# Patient Record
Sex: Female | Born: 1960 | Race: Black or African American | Hispanic: No | Smoking: Never smoker
Health system: Southern US, Community
[De-identification: ages and names within clinical notes are randomized; demographics above are authoritative.]

## PROBLEM LIST (undated history)

## (undated) DIAGNOSIS — E119 Type 2 diabetes mellitus without complications: Secondary | ICD-10-CM

## (undated) DIAGNOSIS — D72829 Elevated white blood cell count, unspecified: Secondary | ICD-10-CM

## (undated) DIAGNOSIS — I10 Essential (primary) hypertension: Secondary | ICD-10-CM

## (undated) HISTORY — DX: Type 2 diabetes mellitus without complications: E11.9

## (undated) HISTORY — DX: Elevated white blood cell count, unspecified: D72.829

## (undated) HISTORY — PX: APPENDECTOMY: SHX54

## (undated) HISTORY — PX: TUBAL LIGATION: SHX77

---

## 2001-11-28 ENCOUNTER — Emergency Department (HOSPITAL_COMMUNITY): Admission: EM | Admit: 2001-11-28 | Discharge: 2001-11-28 | Payer: Self-pay | Admitting: Emergency Medicine

## 2008-07-30 ENCOUNTER — Inpatient Hospital Stay (HOSPITAL_COMMUNITY): Admission: EM | Admit: 2008-07-30 | Discharge: 2008-08-05 | Payer: Self-pay | Admitting: Emergency Medicine

## 2008-08-03 ENCOUNTER — Encounter (INDEPENDENT_AMBULATORY_CARE_PROVIDER_SITE_OTHER): Payer: Self-pay | Admitting: *Deleted

## 2008-08-04 ENCOUNTER — Encounter (INDEPENDENT_AMBULATORY_CARE_PROVIDER_SITE_OTHER): Payer: Self-pay | Admitting: *Deleted

## 2008-08-13 ENCOUNTER — Ambulatory Visit: Payer: Self-pay | Admitting: Internal Medicine

## 2008-08-14 ENCOUNTER — Ambulatory Visit: Payer: Self-pay | Admitting: *Deleted

## 2008-09-06 ENCOUNTER — Encounter (INDEPENDENT_AMBULATORY_CARE_PROVIDER_SITE_OTHER): Payer: Self-pay | Admitting: *Deleted

## 2008-09-09 ENCOUNTER — Telehealth: Payer: Self-pay | Admitting: Gastroenterology

## 2008-09-15 ENCOUNTER — Ambulatory Visit (HOSPITAL_COMMUNITY): Admission: RE | Admit: 2008-09-15 | Discharge: 2008-09-15 | Payer: Self-pay | Admitting: General Surgery

## 2008-09-15 ENCOUNTER — Encounter (INDEPENDENT_AMBULATORY_CARE_PROVIDER_SITE_OTHER): Payer: Self-pay | Admitting: *Deleted

## 2008-09-16 DIAGNOSIS — I10 Essential (primary) hypertension: Secondary | ICD-10-CM | POA: Insufficient documentation

## 2008-09-16 DIAGNOSIS — E669 Obesity, unspecified: Secondary | ICD-10-CM

## 2008-09-16 DIAGNOSIS — E1165 Type 2 diabetes mellitus with hyperglycemia: Secondary | ICD-10-CM | POA: Insufficient documentation

## 2008-09-16 DIAGNOSIS — E119 Type 2 diabetes mellitus without complications: Secondary | ICD-10-CM

## 2008-09-17 ENCOUNTER — Ambulatory Visit: Payer: Self-pay | Admitting: Gastroenterology

## 2008-09-17 DIAGNOSIS — R933 Abnormal findings on diagnostic imaging of other parts of digestive tract: Secondary | ICD-10-CM

## 2008-09-18 ENCOUNTER — Ambulatory Visit: Payer: Self-pay | Admitting: Gastroenterology

## 2008-09-18 ENCOUNTER — Telehealth: Payer: Self-pay | Admitting: Gastroenterology

## 2008-09-30 ENCOUNTER — Encounter (INDEPENDENT_AMBULATORY_CARE_PROVIDER_SITE_OTHER): Payer: Self-pay | Admitting: General Surgery

## 2008-09-30 ENCOUNTER — Ambulatory Visit (HOSPITAL_COMMUNITY): Admission: RE | Admit: 2008-09-30 | Discharge: 2008-09-30 | Payer: Self-pay | Admitting: General Surgery

## 2010-08-23 IMAGING — CT CT ABDOMEN W/ CM
2 of 5 series · 17 of 46 positions shown, 19 images · IV contrast (READICAT & 100 ML OMNI 300)
Comparison: 08/04/2008

CT ABDOMEN

CLINICAL DATA: History of appendicitis.

CT ABDOMEN AND PELVIS WITH CONTRAST
TECHNIQUE: Multidetector CT imaging of the abdomen and pelvis was
performed using the standard protocol following bolus
administration of intravenous contrast.
Contrast: 100 ml of Ymnipaque-EBB

[Series 2: routine abdomen · axial · 0.82mm/px · z∈[-456,-111]mm · 14 of 78 slices shown, 16 images]
[im 5/78  soft-tissue]
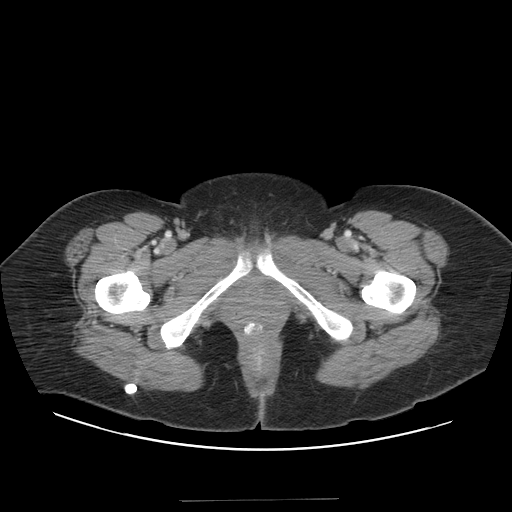
[im 5/78  bone]
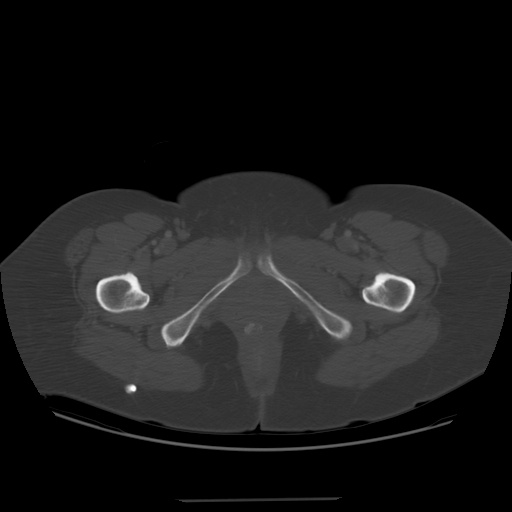
[im 9/78  soft-tissue]
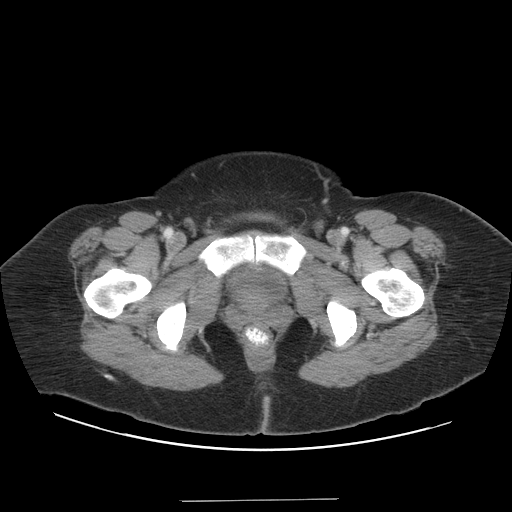
[im 17/78  soft-tissue]
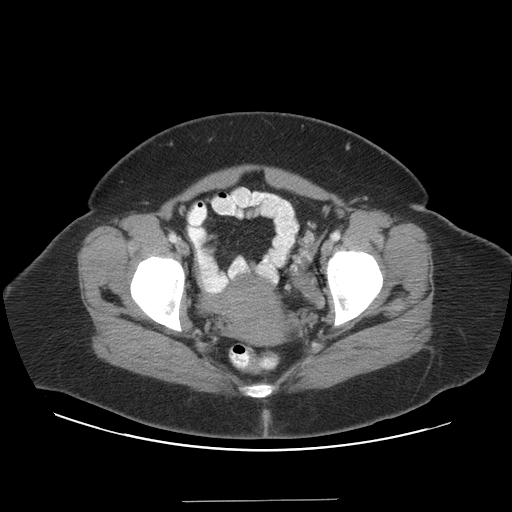
[im 21/78  soft-tissue]
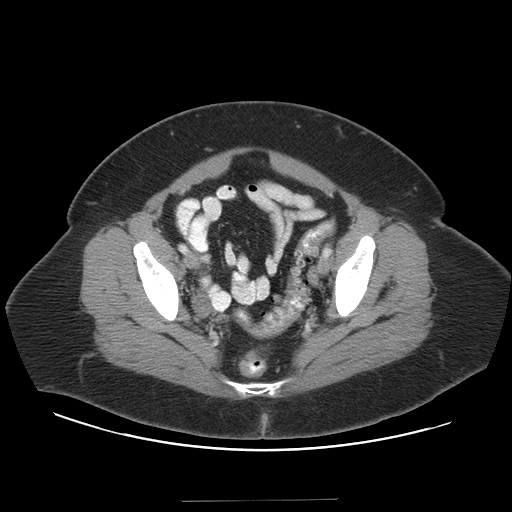
[im 25/78  soft-tissue]
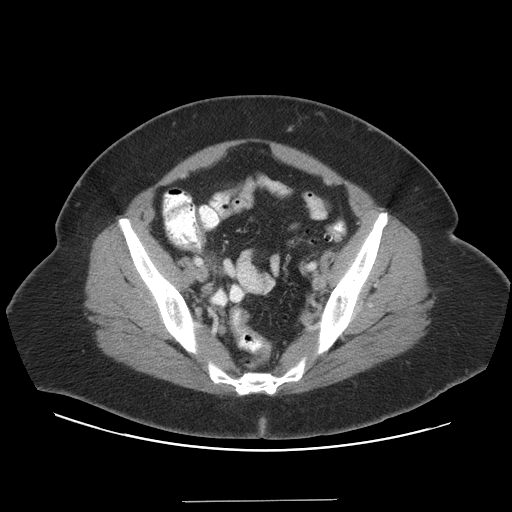
[im 33/78  soft-tissue]
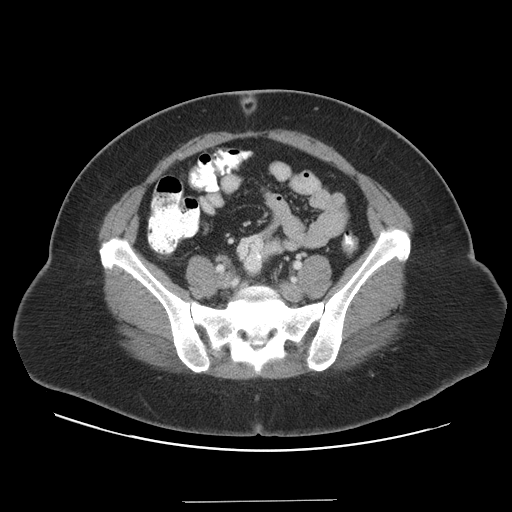
[im 37/78  soft-tissue]
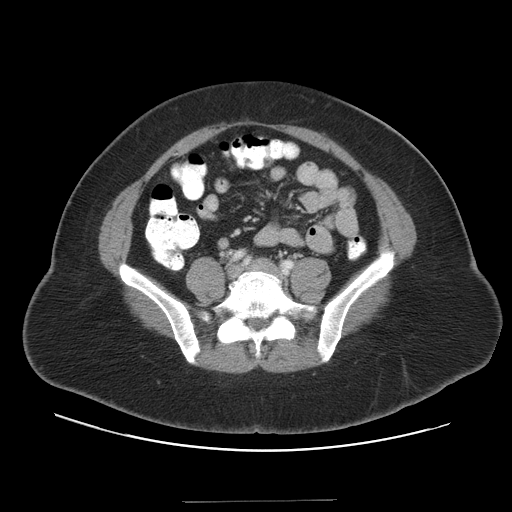
[im 41/78  soft-tissue]
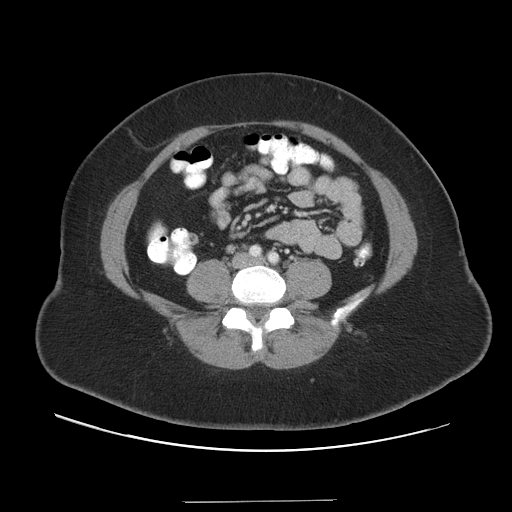
[im 45/78  soft-tissue]
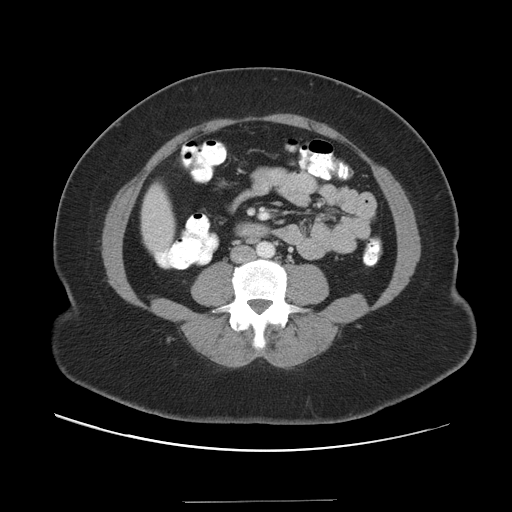
[im 45/78  bone]
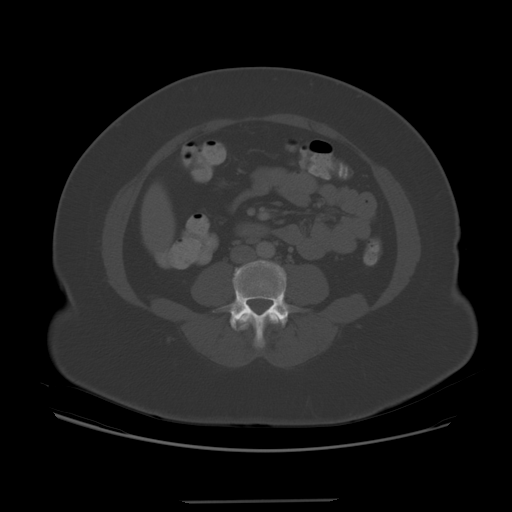
[im 53/78  soft-tissue]
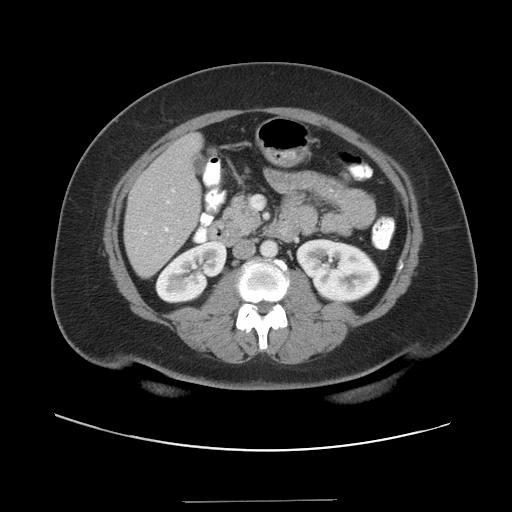
[im 57/78  soft-tissue]
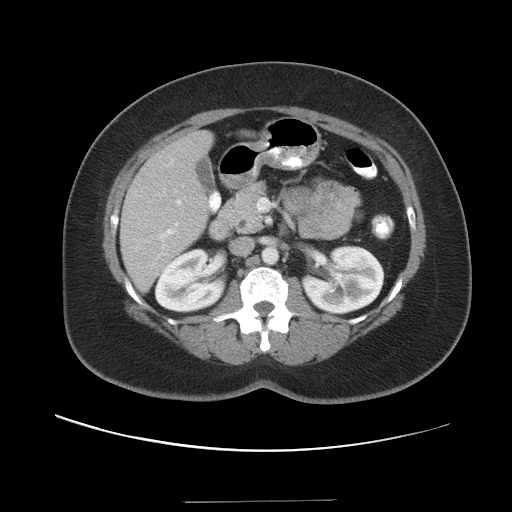
[im 61/78  soft-tissue]
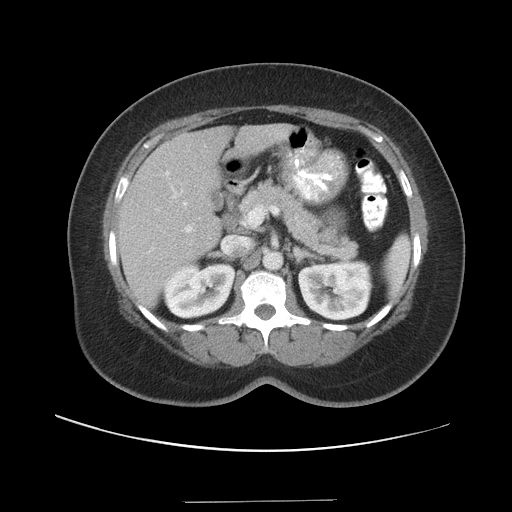
[im 69/78  soft-tissue]
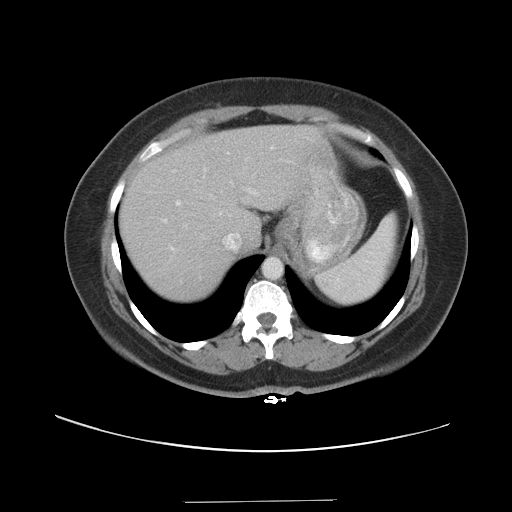
[im 73/78  soft-tissue]
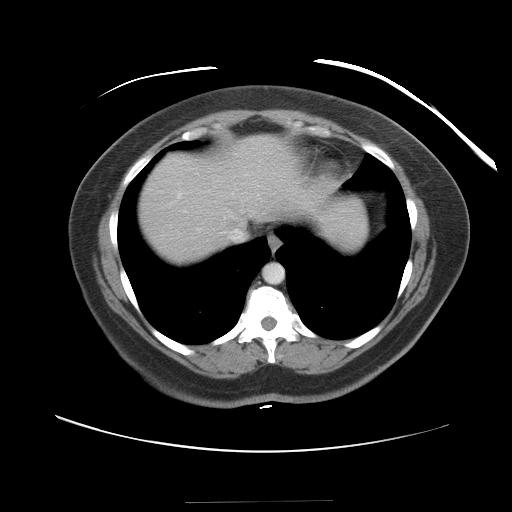

[Series 401: coronal · coronal · 0.82mm/px · 3 of 81 slices shown]
[im 27/81  soft-tissue]
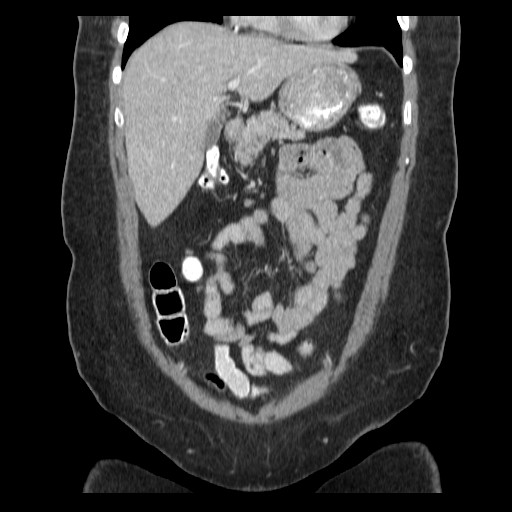
[im 36/81  soft-tissue]
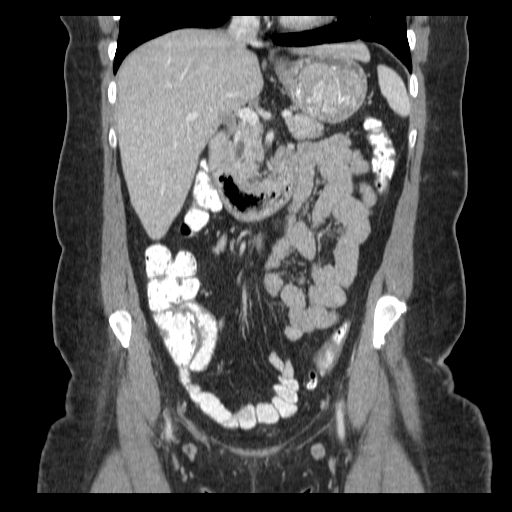
[im 45/81  soft-tissue]
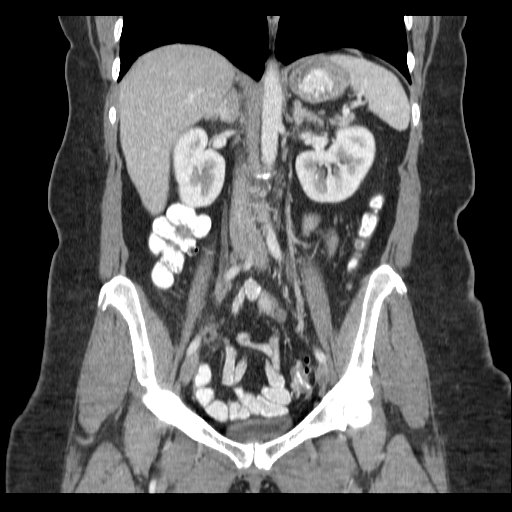

[17 of 46 positions shown; findings below may reference images not displayed]

FINDINGS: The solid abdominal organs are unremarkable and stable
in appearance.  No change and left renal cyst.  There are small
scattered mesenteric and retroperitoneal lymph nodes but no
adenopathy.  The stomach, duodenum, small bowel and colon
demonstrate no significant findings.  The aorta is normal in
caliber.  Gallbladder appears normal.  No significant bony
findings.
IMPRESSION: Unremarkable and stable CT appearance of the abdomen

CT PELVIS
FINDINGS: Resolution of the right lower quadrant inflammatory
process.  Minimal residual streaky scarring type changes in the
region of the appendix.  No pelvic abscess or acute inflammatory
changes.  There are small scattered pericecal and bilateral pelvic
sidewall lymph nodes.  The uterus and ovaries are unremarkable and
stable.  The bladder appears normal.  There is diverticulosis of
the sigmoid colon and mild wall thickening.  No pelvic mass,
adenopathy or free pelvic fluid collection.  Visualized small bowel
loops appear normal.  The cecum and terminal ileum are
unremarkable.

No significant bony findings.  No inguinal mass or hernia.
IMPRESSION: 1.  Resolution of pelvic inflammatory process.  Minimal residual
scarring type changes in the region of the appendix.
2.  Diverticulosis of the sigmoid colon.
3.  Unremarkable CT appearance of the uterus and ovaries.

## 2010-08-24 LAB — GLUCOSE, CAPILLARY
Glucose-Capillary: 120 mg/dL — ABNORMAL HIGH (ref 70–99)
Glucose-Capillary: 155 mg/dL — ABNORMAL HIGH (ref 70–99)

## 2010-08-24 LAB — URINALYSIS, ROUTINE W REFLEX MICROSCOPIC
Glucose, UA: NEGATIVE mg/dL
Hgb urine dipstick: NEGATIVE
Specific Gravity, Urine: 1.014 (ref 1.005–1.030)
Urobilinogen, UA: 0.2 mg/dL (ref 0.0–1.0)

## 2010-08-24 LAB — COMPREHENSIVE METABOLIC PANEL
BUN: 13 mg/dL (ref 6–23)
Calcium: 9.9 mg/dL (ref 8.4–10.5)
Creatinine, Ser: 0.68 mg/dL (ref 0.4–1.2)
GFR calc non Af Amer: >60 mL/min (ref >60)
Glucose, Bld: 128 mg/dL — ABNORMAL HIGH (ref 70–99)
Total Protein: 7.9 g/dL (ref 6.0–8.3)

## 2010-08-24 LAB — CBC
HCT: 41.7 % (ref 36.0–46.0)
Hemoglobin: 13.6 g/dL (ref 12.0–15.0)
MCHC: 32.5 g/dL (ref 30.0–36.0)
MCV: 85.8 fL (ref 78.0–100.0)
RDW: 14 % (ref 11.5–15.5)

## 2010-08-24 LAB — DIFFERENTIAL
Lymphs Abs: 2.6 10*3/uL (ref 0.7–4.0)
Monocytes Relative: 6 % (ref 3–12)
Neutro Abs: 6.9 10*3/uL (ref 1.7–7.7)
Neutrophils Relative %: 67 % (ref 43–77)

## 2010-08-26 LAB — GLUCOSE, CAPILLARY
Glucose-Capillary: 102 mg/dL — ABNORMAL HIGH (ref 70–99)
Glucose-Capillary: 124 mg/dL — ABNORMAL HIGH (ref 70–99)
Glucose-Capillary: 132 mg/dL — ABNORMAL HIGH (ref 70–99)
Glucose-Capillary: 134 mg/dL — ABNORMAL HIGH (ref 70–99)
Glucose-Capillary: 140 mg/dL — ABNORMAL HIGH (ref 70–99)
Glucose-Capillary: 144 mg/dL — ABNORMAL HIGH (ref 70–99)
Glucose-Capillary: 145 mg/dL — ABNORMAL HIGH (ref 70–99)
Glucose-Capillary: 149 mg/dL — ABNORMAL HIGH (ref 70–99)
Glucose-Capillary: 156 mg/dL — ABNORMAL HIGH (ref 70–99)
Glucose-Capillary: 156 mg/dL — ABNORMAL HIGH (ref 70–99)
Glucose-Capillary: 177 mg/dL — ABNORMAL HIGH (ref 70–99)
Glucose-Capillary: 183 mg/dL — ABNORMAL HIGH (ref 70–99)
Glucose-Capillary: 200 mg/dL — ABNORMAL HIGH (ref 70–99)
Glucose-Capillary: 211 mg/dL — ABNORMAL HIGH (ref 70–99)
Glucose-Capillary: 69 mg/dL — ABNORMAL LOW (ref 70–99)
Glucose-Capillary: 97 mg/dL (ref 70–99)

## 2010-08-26 LAB — CBC
HCT: 35.2 % — ABNORMAL LOW (ref 36.0–46.0)
HCT: 36.2 % (ref 36.0–46.0)
Hemoglobin: 11.6 g/dL — ABNORMAL LOW (ref 12.0–15.0)
Hemoglobin: 12 g/dL (ref 12.0–15.0)
MCHC: 32.8 g/dL (ref 30.0–36.0)
MCHC: 32.8 g/dL (ref 30.0–36.0)
MCV: 85.1 fL (ref 78.0–100.0)
MCV: 85.3 fL (ref 78.0–100.0)
MCV: 85.7 fL (ref 78.0–100.0)
MCV: 85.8 fL (ref 78.0–100.0)
Platelets: 368 10*3/uL (ref 150–400)
Platelets: 373 10*3/uL (ref 150–400)
Platelets: 396 10*3/uL (ref 150–400)
Platelets: 429 10*3/uL — ABNORMAL HIGH (ref 150–400)
Platelets: 456 10*3/uL — ABNORMAL HIGH (ref 150–400)
RBC: 4.11 MIL/uL (ref 3.87–5.11)
RBC: 4.65 MIL/uL (ref 3.87–5.11)
RDW: 13.2 % (ref 11.5–15.5)
RDW: 13.3 % (ref 11.5–15.5)
RDW: 13.4 % (ref 11.5–15.5)
WBC: 16.8 10*3/uL — ABNORMAL HIGH (ref 4.0–10.5)
WBC: 27.3 10*3/uL — ABNORMAL HIGH (ref 4.0–10.5)
WBC: 34.1 10*3/uL — ABNORMAL HIGH (ref 4.0–10.5)

## 2010-08-26 LAB — DIFFERENTIAL
Basophils Absolute: 0 10*3/uL (ref 0.0–0.1)
Basophils Absolute: 0 10*3/uL (ref 0.0–0.1)
Basophils Relative: 0 % (ref 0–1)
Eosinophils Absolute: 0.2 10*3/uL (ref 0.0–0.7)
Eosinophils Relative: 0 % (ref 0–5)
Eosinophils Relative: 1 % (ref 0–5)
Eosinophils Relative: 2 % (ref 0–5)
Lymphocytes Relative: 10 % — ABNORMAL LOW (ref 12–46)
Lymphocytes Relative: 19 % (ref 12–46)
Lymphocytes Relative: 8 % — ABNORMAL LOW (ref 12–46)
Lymphs Abs: 2.2 10*3/uL (ref 0.7–4.0)
Lymphs Abs: 2.7 10*3/uL (ref 0.7–4.0)
Lymphs Abs: 3.3 10*3/uL (ref 0.7–4.0)
Monocytes Absolute: 2.1 10*3/uL — ABNORMAL HIGH (ref 0.1–1.0)
Monocytes Relative: 7 % (ref 3–12)
Monocytes Relative: 8 % (ref 3–12)
Neutro Abs: 11.6 10*3/uL — ABNORMAL HIGH (ref 1.7–7.7)
Neutro Abs: 23.4 10*3/uL — ABNORMAL HIGH (ref 1.7–7.7)
Neutrophils Relative %: 69 % (ref 43–77)

## 2010-08-26 LAB — BASIC METABOLIC PANEL
BUN: 1 mg/dL — ABNORMAL LOW (ref 6–23)
BUN: 2 mg/dL — ABNORMAL LOW (ref 6–23)
BUN: 4 mg/dL — ABNORMAL LOW (ref 6–23)
CO2: 25 mEq/L (ref 19–32)
Chloride: 100 mEq/L (ref 96–112)
Chloride: 100 mEq/L (ref 96–112)
Chloride: 98 mEq/L (ref 96–112)
Creatinine, Ser: 0.52 mg/dL (ref 0.4–1.2)
Glucose, Bld: 178 mg/dL — ABNORMAL HIGH (ref 70–99)
Potassium: 3.9 mEq/L (ref 3.5–5.1)
Potassium: 4.3 mEq/L (ref 3.5–5.1)
Sodium: 137 mEq/L (ref 135–145)

## 2010-08-26 LAB — URINALYSIS, ROUTINE W REFLEX MICROSCOPIC
Bilirubin Urine: NEGATIVE
Ketones, ur: 15 mg/dL — AB
Nitrite: NEGATIVE
Specific Gravity, Urine: 1.01 (ref 1.005–1.030)
Urobilinogen, UA: 1 mg/dL (ref 0.0–1.0)
pH: 8 (ref 5.0–8.0)

## 2010-08-26 LAB — CULTURE, BLOOD (ROUTINE X 2): Culture: NO GROWTH

## 2010-08-26 LAB — COMPREHENSIVE METABOLIC PANEL
ALT: 125 U/L — ABNORMAL HIGH (ref 0–35)
AST: 114 U/L — ABNORMAL HIGH (ref 0–37)
Albumin: 2.8 g/dL — ABNORMAL LOW (ref 3.5–5.2)
CO2: 29 mEq/L (ref 19–32)
Calcium: 8.9 mg/dL (ref 8.4–10.5)
Creatinine, Ser: 0.79 mg/dL (ref 0.4–1.2)
GFR calc Af Amer: >60 mL/min (ref >60)
Sodium: 141 mEq/L (ref 135–145)
Total Protein: 7.8 g/dL (ref 6.0–8.3)

## 2010-08-26 LAB — CHOLESTEROL, TOTAL: Cholesterol: 82 mg/dL (ref 0–200)

## 2010-08-26 LAB — HEMOGLOBIN A1C: Hgb A1c MFr Bld: 9.6 % — ABNORMAL HIGH (ref 4.6–6.1)

## 2010-09-28 NOTE — Consult Note (Signed)
[name], [name] NO.:  [id number]   MEDICAL RECORD NO.:  [id number]          PATIENT TYPE:  INP   LOCATION:  1333                         FACILITY:  [facility]   PHYSICIAN:  Altha Harm, MDDATE OF BIRTH:  1960-06-22   DATE OF CONSULTATION:  08/01/2008  DATE OF DISCHARGE:                                 CONSULTATION   REASON FOR CONSULTATION:  Management of diabetes and hypertension.   HISTORY OF PRESENT ILLNESS:  This is a 50 year old African American  female with a history of diabetes type 2 diagnosed approximately 10  years ago who has been without treatment for the duration of her  diagnosis.  The patient is presently admitted for treatment of  appendicitis under the surgical service of Dr. Derrell Lolling.  We were asked to  see the patient for the management of diabetes.  The patient states that  she has had no polyuria, no polydipsia, no polyphagia.  She states that  she tries to control her blood sugars with diet.  She states that she  was initially prescribed metformin for her diabetes, however, 10 years  ago it was cost prohibitive for her and after receiving a prescription  initially she did not continue with the medication.  She was unable to  recall any episodes of hypoglycemia with the use of this medication.  The patient was also put on lisinopril, however, it appears that this  was probably likely more for renal protection than true treatment of  hypertension.  As discussed with the patient the need for medication she  can likely be on the metformin which is 4 dollars at the [facility]  pharmacy and the patient states that this would be affordable for her.   PAST MEDICAL HISTORY:  Significant for diabetes type 2.   FAMILY HISTORY:  Significant for diabetes type 2 and hypertension both  in her mother and father.   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  There is no tobacco, alcohol or drug use.   PREHOSPITALIZATION MEDICATIONS:  None.   CURRENT HOSPITAL MEDICATIONS:  Insulin sliding scale, Zofran, Milk of  Magnesia, heparin for DVT prophylaxis.   REVIEW OF SYSTEMS:  The patient denies any polyuria, polydipsia,  polyphagia.  She denies any vision changes.  She denies any dysesthesias  or paresthesias.  She denies any weakness or numbness focally.  She  denies any headaches or loss of consciousness or any loss of vision.  She denies any cough, hemoptysis.  She denies any dysuria.  She denies  any weight loss or weight gain, heat or cold intolerance or hearing  loss.  Twelve systems are reviewed and all systems are negative except  as noted in the HPI.   PHYSICAL EXAMINATION:  VITAL SIGNS:  Temperature 99.3, T max is 100.2,  heart rate is 96, respiratory rate 20, blood pressure 116/72, O2  saturation 96% on 1 liter.  Her capillary blood gases are ranging  between 130-166.  Fasting capillary blood glucose is 166.  GENERAL:  The patient is lying in bed comfortably in no acute distress.  HEENT:  She is normocephalic, atraumatic.  Pupils are equal, round and  reactive to light and accommodation.  Fundi are benign.  Extraocular  movements are intact.  There is no icterus.  No conjunctival pallor.  Oropharynx is moist.  No exudate, erythema or lesions are noted.  NECK:  Trachea is midline.  No masses, no thyromegaly, no JVD, no  carotid bruit.  RESPIRATORY:  The patient has got normal respiratory effort, equal  excursion bilaterally.  No wheezing or rhonchi noted.  CARDIOVASCULAR:  She has got a normal S1 and S2.  There are no murmurs,  rubs or gallops noted.  PMI is nondisplaced.  No heaves or thrills on  palpation.  ABDOMEN:  Obese.  Soft, nontender, nondistended.  No masses.  No  hepatosplenomegaly noted.  SKIN:  The skin is warm and dry.  There are no rashes noted on the skin.  MUSCULOSKELETAL:  There is no warmth, swelling, erythema around the  joints.  She has got full range of  motion.  NEUROLOGICAL:  Cranial nerves II-XII are grossly intact.  No focal  neurological deficits noted.  DTRs are 2+ bilaterally upper and lower  extremities.   ASSESSMENT AND PLAN:  This is a patient who presents with:  1. Diabetes type 2.  The patient just had a recent contrast study      within 48 hours and thus I will put her on a basal dose of Lantus.      However, the patient has nothing that would prohibit her from      taking beyond the time frame of contrast study.  I think that it      would be most useful for the patient to be discharged on a      medication which she can afford.  I have discussed with her the use      of metformin.  The patient will have her hemoglobin A1c done to      better provide therapy.  2. In terms of hypertension the patient has exhibited no evidence of      hypertension.  I suspect that the patient was given lisinopril for      renal protection in the past.  I will get a urinalysis on the      patient to check for micro albumin and protein spilling at this      time and the patient should likely be started on a small dose of      lisinopril for renal protection.  I will start her on 5 mg and      evaluate the effect on blood pressure.  If the patient's blood      pressure is able to tolerate it then it would be useful for her to      be on a renal protective agent such as this.   We will be happy to follow along with this patient.  Thank you for the  consult.  Total time for this consult 57 minutes.      Altha Harm, MD  Electronically Signed     MAM/MEDQ  D:  08/01/2008  T:  08/01/2008  Job:  [id number]

## 2010-09-28 NOTE — H&P (Signed)
NAMETORRYN, FISKE NO.:  [id number]   MEDICAL RECORD NO.:  [id number]          PATIENT TYPE:  EMS   LOCATION:  ED                           FACILITY:  [facility]   PHYSICIAN:  Angelia Mould. Derrell Lolling, M.D.DATE OF BIRTH:  01-29-1961   DATE OF ADMISSION:  07/29/2008  DATE OF DISCHARGE:                              HISTORY & PHYSICAL   CHIEF COMPLAINT:  Lower abdominal pain.   HISTORY OF PRESENT ILLNESS:  This is a 50 year old black female who  presents with an 11-day history of lower abdominal pain.   She states that 11 days ago after eating at [facility], she developed  moderately abrupt onset of periumbilical pain.  This got worse over the  next couple of days.  The first 24 hours, she had a few episodes of  nausea and vomiting.  Her bowel movements have been infrequent, a little  bit softer than usual, but no diarrhea, no blood.  Her last menstrual  period was 10 years ago.  She is not sexually active.   She endured the pain for 24 hours, but then went to the [facility] walk-in  cl[facility] on [facility] [address] [address] [facility] [address].  She was told she had gastroenteritis.  She was given an injection for nausea.  Since that time, she had no more  nausea or vomiting, but has remained significantly anorexic and has lost  7 pounds according to her.  She denies vaginal or rectal bleeding.  She  denies any similar prior problems.  As mentioned above, her last  menstrual period was 10 years ago.  She does not think she has had any  fever, but may have had some mild chills.   Her condition has been fairly stable over the past 10 days, not getting  worse or better and she finally decided to come to the emergency room  last night.  While here, she was evaluated and found to have  leukocytosis of 27,000 and a CT scan shows a thickened appendix and a  significant inflammatory process and phlegmon in the right lower  quadrant.  CT scan shows no drainable abscess.  There is no evidence of  any bowel  obstruction.  This appears to be a localized process, although  with significant phlegmon.   She is being admitted for further evaluation and management of a  presumed complex appendicitis with delayed diagnosis.   PAST HISTORY:  She has been told in the past that she had diabetes and  hypertension, but has not been on any medications or sought medical help  for 10 years.  She has no other known medical problems.  She denies any  prior surgical events.   CURRENT MEDICATIONS:  None.   DRUG ALLERGIES:  NONE KNOWN.   SOCIAL HISTORY:  She is single, has two daughters.  She works in the  [employer].  She denies alcohol or tobacco.   FAMILY HISTORY:  Father living and has diabetes and hypertension.  Mother died at childbirth.   REVIEW OF SYSTEMS:  A 10 system review of systems is performed and is  noncontributory except as described above.  PHYSICAL EXAMINATION:  GENERAL:  Cooperative, pleasant, alert, oriented,  obese African American female.  She does not appear toxic at all.  She  has numerous family members with her throughout the encounter.  VITAL SIGNS:  Temperature 98.5, pulse 98, respirations 18, blood  pressure 141/77.  HEENT:  Eyes; sclerae are clear.  Extraocular movements intact.  Ears,  mouth, throat, nose, lips, tongue and oropharynx are without gross  lesions.  NECK:  Supple, nontender.  No mass.  No jugulovenous distention.  LUNGS:  Clear to auscultation.  No tenderness to percuss her anterior  costal margins.  No tenderness to percuss her costovertebral angles  posteriorly.  HEART:  Regular rate and rhythm, borderline tachycardic, no murmur,  radial and femoral pulses are palpable.  BREASTS:  Not examined.  ABDOMEN:  Obese.  Active bowel sounds.  Generally soft and nondistended  in the upper abdomen.  Tender with some guarding in the lower abdomen,  right lower quadrant, suprapubic and left lower quadrant.  The abdomen  seems somewhat softer  and with less guarding in the left lower quadrant  than in the right lower quadrant in the suprapubic area.  I do not feel  a mass.  I do not see any scars.  I do not see any see any hernias.  GENITOURINARY:  There is no inguinal hernia or mass.  EXTREMITIES:  She moves all four extremities well without pain or  deformity.  NEUROLOGIC:  No gross motor or sensory deficits.   ADMISSION DATA:  CT scan with findings as described above, suggesting  complex appendicitis with significant phlegmon, but no drainable  abscess.  White blood cell count 27,500, hemoglobin 13.1.  Urinalysis  clear.  Complete metabolic panel shows a potassium of 3.0, glucose of  162, BUN 5, creatinine 0.7.  She has a lipase of 28.   ASSESSMENT:  1. Appendicitis.  This is complex with significant surrounding      phlegmon, but no drainable abscess.  This has been going on for 11      days by history.  2. Obesity.  3. History of diabetes.  4. History of hypertension.   PLAN:  1. The patient will be admitted to the hospital for IV antibiotics and      fluid hydration.  2. I have discussed management options with her and have recommended,      at least initially, medical treatment with antibiotics and IV      fluids and bowel rest in hopes that the inflammation will subside      and we can do an interval appendectomy and 6-8 weeks.  3. I have told her that if her condition deteriorates she may need      surgery sooner.  4. I think we can allow her clear liquids since there is no      obstruction or nausea at this time.   The patient and her family are in agreement with this plan.      Angelia Mould. Derrell Lolling, M.D.  Electronically Signed     HMI/MEDQ  D:  07/30/2008  T:  07/30/2008  Job:  [id number]

## 2010-09-28 NOTE — Op Note (Signed)
[name], VO NO.:  [id number]   MEDICAL RECORD NO.:  [id number]          PATIENT TYPE:  AMB   LOCATION:  DAY                          FACILITY:  [facility]   PHYSICIAN:  Angelia Mould. Derrell Lolling, M.D.DATE OF BIRTH:  1960-06-29   DATE OF PROCEDURE:  09/30/2008  DATE OF DISCHARGE:                               OPERATIVE REPORT   PREOPERATIVE DIAGNOSIS:  Chronic appendicitis.   POSTOPERATIVE DIAGNOSIS:  Chronic appendicitis.   OPERATION PERFORMED:  Laparoscopic appendectomy.   SURGEON:  Dr. Claud Kelp.   FIRST ASSISTANT:  Dr. Ovidio Kin.   OPERATIVE INDICATIONS:  This is a 50 year old black female with  diabetes, hypertension and obesity who presented to the emergency room  on July 29, 2008, with an 11-day history of lower abdominal pain.  She  was initially evaluated by a primary care and was thought to have  gastroenteritis.  When the pain persisted, she presented to the [facility] emergency room and was found to have lower abdominal tenderness, a  white blood cell count of 27,000, and a CT scan that showed a thickened  appendix and a large inflammatory phlegmon in the right lower quadrant,  but no drainable abscess.  I was asked to see her.  I felt that because  of the delayed presentation and the huge phlegmon that she was at  increased risk for immediate surgery.  She was admitted and treated with  intravenous antibiotics and her pain and leukocytosis resolved.  She was  discharged home on August 05, 2008, and given another 10 days of oral  antibiotics.  She has had a colonoscopy which showed no tumor.  She  feels well now.  She was advised to undergo elective appendectomy and  was counseled as an outpatient.   OPERATIVE FINDINGS:  The patient's appendix was chronically inflamed.  There were moderate adhesions to it.  The tip of the appendix was stuck  to an area of small bowel mesentery and we were able to debride that  without too much difficulty.   There were moderate soft chronic adhesions  in the right lower quadrant, but we were ultimately able to separate the  appendix from the terminal ileum and cecum without too much difficulty.  Visualization of the anatomy was fairly good.  In the right colon,  terminal ileum, uterus, liver, peritoneal surfaces otherwise looked  normal.   OPERATIVE TECHNIQUE:  Following the induction of general endotracheal  anesthesia, the patient's abdomen was prepped and draped in a sterile  fashion.  A Foley catheter had been inserted by the nursing staff prior  to the abdominal draping.  Intravenous antibiotics were given.  The  patient was identified as the correct patient and correct procedure.  Marcaine 0.5% with epinephrine was used as a local infiltration  anesthetic.  A vertically oriented incision was made at the superior rim  of the umbilicus.  The fascia was incised in the midline.  The abdominal  cavity was entered under direct vision.  An 11-mm Hassan trocar was  inserted and secured with a pursestring suture of 0 Vicryl.  Pneumoperitoneum was created.  Video  cam was inserted with visualization  and findings as described above.  A 5-mm trocar was placed in the left  lower quadrant and a 12 mm trocar was placed in the suprapubic area.   We identified the anatomy in the region.  We mobilized the small bowel  up out of the pelvis and right lower quadrant.  We identified the  ileocecal valve and the terminal ileum and we could see the base of the  appendix.  We traced this down and found where the tip of the appendix  was adherent to a loop of small bowel.  We lifted this up and inspected  circumferentially.  It did not appear to involve the small bowel, lumen  or serosa in anyway.  We simply debrided this with scissors and it came  off of the small bowel quite nicely.  Dr. Ezzard Standing and I inspected the  small bowel, irrigated things off and felt there was no injury  whatsoever to the intestine.   There was no bleeding.   We used the harmonic and took down the appendiceal mesentery until we  had it skeletonized all the way back to the base of the cecum.  Once we  had this done, we were able to place an Endo-GIA stapler across the base  of the appendix at its junction with the cecum.  We closed the stapler,  held it in place for about 30 seconds, fired it and removed it.  The  staple line on the cecum looked good.  The appendix was placed in a  specimen bag and removed.  We irrigated the operative field.  The staple  line was hemostatic.  There was no sign of any injury or unusual  anatomy.  We repositioned the patient and placed the omentum back down  over the staple line of the cecum.  The trocars were removed under  direct vision.  There was no bleeding from the trocar sites.  Pneumoperitoneum was released.  The fascia at the umbilicus was closed  with 0 Vicryl sutures.  The skin incisions were closed with subcuticular  sutures of 4-0 Monocryl and Steri-Strips.  Clean bandages were placed  and the patient taken to the recovery room in stable condition.  Estimated blood loss was about 10 mL.  Complications none.  Sponge,  needle and instrument counts were correct.      Angelia Mould. Derrell Lolling, M.D.  Electronically Signed     HMI/MEDQ  D:  09/30/2008  T:  09/30/2008  Job:  [id number]   cc:   [facility]

## 2010-09-28 NOTE — Discharge Summary (Signed)
[name], [name] NO.:  [id number]   MEDICAL RECORD NO.:  [id number]          PATIENT TYPE:  INP   LOCATION:  1333                         FACILITY:  [facility]   PHYSICIAN:  Angelia Mould. Derrell Lolling, M.D.DATE OF BIRTH:  20-Dec-1960   DATE OF ADMISSION:  07/29/2008  DATE OF DISCHARGE:  08/05/2008                               DISCHARGE SUMMARY   FINAL DIAGNOSES:  1. Complex appendicitis with delayed diagnosis.  2. Diabetes mellitus type 2.  3. Hypertension.  4. Obesity.   HISTORY:  This is a 50 year old black female who presented with an 11  day history of abdominal pain.  She was evaluated at an outpatient  clinic and was told she had gastroenteritis.  She remained anorexic and  had pain off and on but was relatively stable.  She came to the  emergency room for evaluation.  A CT scan was performed which showed  extensive right lower quadrant inflammatory process around a thickened  appendix with phlegmon but no abscess, no drainable abscess.  No  evidence of bowel obstruction, no free fluid and no free air.  Her white  blood cell count was 27,500.  I was called to see her at that point.   PHYSICAL EXAMINATION:  GENERAL:  Cooperative African American female in  mild to moderate distress but she did not appear to be toxic.  VITAL SIGNS:  Temperature 98.5, pulse 98, respirations 18, blood  pressure 141/77.  ABDOMEN:  Active bowel sounds.  Obese.  Localized tenderness and  guarding in the lower abdomen, right lower quadrant, suprapubic and to a  lesser extent left lower quadrant.  I really did not feel a mass.  I did  not see any hernias.   HOSPITAL COURSE:  The patient was advised to be admitted to the hospital  and she agreed to that.  Because this was probably 11 days into the  course of a complex appendicitis with a very large phlegmon, I felt that  it would be in her best interest to treat her medically initially and  plan interval appendectomy in about 6 or 8  weeks.  This was explained to  her and she was comfortable with that.   Over the next 2-3 days her pain and nausea improved a great deal.  She  did have stools.  Initially she had fever to 101 but that resolved.  Her  white count stayed high for 2-3 days and then eventually started coming  down.  Because of her diabetes and hypertension I asked the [facility]  medical service to see her and they were kind enough to do that and help  manage her hypertension and diabetes.  The patient's pain completely  resolved and she resumed diet and bowel function.  We repeated her CT  scan prior to discharge and it showed that the inflammatory process was  resolving and there was no evidence of abscess or bowel obstruction.   On discharge, she was discharged on March 23.  She was given a  prescription for Cipro and Flagyl for 10 days.  She also was given a  prescription for lisinopril 5  mg a day and glipizide 5 mg a day.  She  was asked to follow up with  me in the office in 2 weeks and I told her that I thought that she  should have an appendectomy in about 6-8 weeks.  She was referred to  [facility] and to [facility] pharmacy for management of her  diabetes and hypertension.      Angelia Mould. Derrell Lolling, M.D.  Electronically Signed     HMI/MEDQ  D:  09/06/2008  T:  09/07/2008  Job:  [id number]   cc:   Melvern Banker  Fax: [phone]

## 2012-06-20 ENCOUNTER — Ambulatory Visit: Payer: BC Managed Care – PPO

## 2012-06-22 ENCOUNTER — Ambulatory Visit (INDEPENDENT_AMBULATORY_CARE_PROVIDER_SITE_OTHER): Payer: BC Managed Care – PPO | Admitting: Family Medicine

## 2012-06-22 VITALS — BP 163/79 | HR 97 | Temp 98.7°F | Resp 16 | Ht 61.5 in | Wt 191.0 lb

## 2012-06-22 DIAGNOSIS — E119 Type 2 diabetes mellitus without complications: Secondary | ICD-10-CM

## 2012-06-22 DIAGNOSIS — I10 Essential (primary) hypertension: Secondary | ICD-10-CM

## 2012-06-22 DIAGNOSIS — Z0000 Encounter for general adult medical examination without abnormal findings: Secondary | ICD-10-CM

## 2012-06-22 LAB — POCT CBC
Granulocyte percent: 65.1 %G (ref 37–80)
HCT, POC: 44.5 % (ref 37.7–47.9)
Hemoglobin: 14 g/dL (ref 12.2–16.2)
Lymph, poc: 4.1 — AB (ref 0.6–3.4)
MCH, POC: 27.6 pg (ref 27–31.2)
MCHC: 31.5 g/dL — AB (ref 31.8–35.4)
MCV: 87.5 fL (ref 80–97)
MID (cbc): 0.8 (ref 0–0.9)
MPV: 9.2 fL (ref 0–99.8)
POC Granulocyte: 9.2 — AB (ref 2–6.9)
POC LYMPH PERCENT: 29.2 %L (ref 10–50)
POC MID %: 5.7 % (ref 0–12)
Platelet Count, POC: 360 10*3/uL (ref 142–424)
RBC: 5.08 M/uL (ref 4.04–5.48)
RDW, POC: 13.9 %
WBC: 14.2 10*3/uL — AB (ref 4.6–10.2)

## 2012-06-22 LAB — POCT UA - MICROSCOPIC ONLY
Casts, Ur, LPF, POC: NEGATIVE
Crystals, Ur, HPF, POC: NEGATIVE
Yeast, UA: NEGATIVE

## 2012-06-22 LAB — POCT URINALYSIS DIPSTICK
Bilirubin, UA: NEGATIVE
Blood, UA: NEGATIVE
Glucose, UA: NEGATIVE
Ketones, UA: NEGATIVE
Leukocytes, UA: NEGATIVE
Nitrite, UA: NEGATIVE
Protein, UA: NEGATIVE
Spec Grav, UA: 1.015
Urobilinogen, UA: 0.2
pH, UA: 5.5

## 2012-06-22 LAB — IFOBT (OCCULT BLOOD): IFOBT: NEGATIVE

## 2012-06-22 LAB — POCT GLYCOSYLATED HEMOGLOBIN (HGB A1C): Hemoglobin A1C: 10.1

## 2012-06-22 MED ORDER — GLIPIZIDE 10 MG PO TABS: 10.0000 mg | ORAL_TABLET | Freq: Two times a day (BID) | ORAL | Status: DC

## 2012-06-22 MED ORDER — LISINOPRIL 10 MG PO TABS: 10.0000 mg | ORAL_TABLET | Freq: Every day | ORAL | Status: DC

## 2012-06-22 MED ORDER — METFORMIN HCL 1000 MG PO TABS: 1000.0000 mg | ORAL_TABLET | Freq: Two times a day (BID) | ORAL | Status: DC

## 2012-06-22 NOTE — Progress Notes (Signed)
Tuberculosis Risk Questionnaire  1. Were you born outside the [place] in one of the following parts of the world:    [place], [place], [place], [place] or [place]?  No  2. Have you traveled outside the [place] and lived for more than one month in one of the following parts of the world:  [place], [place], [place], [place] or [place]?  No  3. Do you have a compromised immune system such as from any of the following conditions:  HIV/AIDS, organ or bone marrow transplantation, diabetes, immunosuppressive   medicines (e.g. Prednisone, Remicaide), leukemia, lymphoma, cancer of the   head or neck, gastrectomy or jejunal bypass, end-stage renal disease (on   dialysis), or silicosis?  Yes Diabetic    4. Have you ever done one of the following:    Used crack cocaine, injected illegal drugs, worked or resided in jail or prison,   worked or resided at a homeless shelter, or worked as a Research scientist (physical sciences) in   direct contact with patients?  No  5. Have you ever been exposed to anyone with infectious tuberculosis?  No   Tuberculosis Symptom Questionnaire  Do you currently have any of the following symptoms?  1. Unexplained cough lasting more than 3 weeks? No  Unexplained fever lasting more than 3 weeks. No   3. Night Sweats (sweating that leaves the bedclothes and sheets wet)   No  4. Shortness of Breath No  5. Chest Pain No  6. Unintentional weight loss  No  7. Unexplained fatigue (very tired for no reason) No   [facility]:  Office Visit  Chief Complaint:  Chief Complaint  Patient presents with  . cpe    pap    HPI: [name] is a 52 y.o. female who complains of  Annual exam. She has diabetes-took meds today but has not seen PCP since ? 2011 Eye exam was done yesterday--needs glasses.  Last pap was in 1990s No h/o abnormal paps, no h/o STDs.  Not UTD on mammogram 2012 colonscopy-normal Appendectomy Needs form for  daycare  PCP is [facility]  Past Medical History  Diagnosis Date  . Diabetes mellitus without complication    History reviewed. No pertinent past surgical history. History   Social History  . Marital Status: Divorced    Spouse Name: N/A    Number of Children: N/A  . Years of Education: N/A   Social History Main Topics  . Smoking status: Never Smoker   . Smokeless tobacco: None  . Alcohol Use: No  . Drug Use: No  . Sexually Active: No   Other Topics Concern  . None   Social History Narrative  . None   Family History  Problem Relation Age of Onset  . Anemia Mother   . Diabetes Father   . Hypertension Daughter    No Known Allergies Prior to Admission medications   Medication Sig Start Date End Date Taking? Authorizing Provider  glipiZIDE (GLUCOTROL) 10 MG tablet Take 10 mg by mouth 2 (two) times daily before a meal.   Yes Historical Provider, MD  lisinopril (PRINIVIL,ZESTRIL) 10 MG tablet Take 10 mg by mouth daily.   Yes Historical Provider, MD  metFORMIN (GLUCOPHAGE) 1000 MG tablet Take 1,000 mg by mouth 2 (two) times daily with a meal.   Yes Historical Provider, MD     ROS: The patient denies fevers, chills, night sweats, unintentional weight loss, chest pain, palpitations, wheezing, dyspnea on exertion, nausea,  vomiting, abdominal pain, dysuria, hematuria, melena, numbness, weakness, or tingling.  All other systems have been reviewed and were otherwise negative with the exception of those mentioned in the HPI and as above.    PHYSICAL EXAM: Filed Vitals:   06/22/12 1722  BP: 163/79  Pulse: 97  Temp: 98.7 F (37.1 C)  Resp: 16   Filed Vitals:   06/22/12 1722  Height: 5' 1.5" (1.562 m)  Weight: 191 lb (86.637 kg)   Body mass index is 35.50 kg/(m^2).  General: Alert, no acute distress HEENT:  Normocephalic, atraumatic, oropharynx patent. EOMI, PERRLA, fundoscopic exam nl, poor dentition. NO exudates. Poor dentition Cardiovascular:  Regular rate and  rhythm, no rubs murmurs or gallops.  No Carotid bruits, radial pulse intact. No pedal edema.  Respiratory: Clear to auscultation bilaterally.  No wheezes, rales, or rhonchi.  No cyanosis, no use of accessory musculature GI: No organomegaly, abdomen is soft and non-tender, positive bowel sounds.  No masses. Skin: No rashes. Neurologic: Facial musculature symmetric. Psychiatric: Patient is appropriate throughout our interaction. Lymphatic: No cervical lymphadenopathy Musculoskeletal: Gait intact. GU-nl except hemorrhoids at 6 oclock Breast exam nl   LABS: Results for orders placed in visit on 06/22/12  POCT CBC      Component Value Range   WBC 14.2 (*) 4.6 - 10.2 K/uL   Lymph, poc 4.1 (*) 0.6 - 3.4   POC LYMPH PERCENT 29.2  10 - 50 %L   MID (cbc) 0.8  0 - 0.9   POC MID % 5.7  0 - 12 %M   POC Granulocyte 9.2 (*) 2 - 6.9   Granulocyte percent 65.1  37 - 80 %G   RBC 5.08  4.04 - 5.48 M/uL   Hemoglobin 14.0  12.2 - 16.2 g/dL   HCT, POC 16.1  09.6 - 47.9 %   MCV 87.5  80 - 97 fL   MCH, POC 27.6  27 - 31.2 pg   MCHC 31.5 (*) 31.8 - 35.4 g/dL   RDW, POC 04.5     Platelet Count, POC 360  142 - 424 K/uL   MPV 9.2  0 - 99.8 fL  POCT UA - MICROSCOPIC ONLY      Component Value Range   WBC, Ur, HPF, POC 0-2     RBC, urine, microscopic 0-1     Bacteria, U Microscopic 1+     Mucus, UA trace     Epithelial cells, urine per micros 0-3     Crystals, Ur, HPF, POC neg     Casts, Ur, LPF, POC neg     Yeast, UA neg    POCT URINALYSIS DIPSTICK      Component Value Range   Color, UA yellow     Clarity, UA clear     Glucose, UA neg     Bilirubin, UA neg     Ketones, UA neg     Spec Grav, UA 1.015     Blood, UA neg     pH, UA 5.5     Protein, UA neg     Urobilinogen, UA 0.2     Nitrite, UA neg     Leukocytes, UA Negative    POCT GLYCOSYLATED HEMOGLOBIN (HGB A1C)      Component Value Range   Hemoglobin A1C 10.1    IFOBT (OCCULT BLOOD)      Component Value Range   IFOBT Negative        EKG/XRAY:   Primary read interpreted by Dr. Conley Rolls  at [facility].   ASSESSMENT/PLAN: Encounter Diagnoses  Name Primary?  . Annual physical exam Yes  . Diabetes   . HTN (hypertension)    I  have asked patient to return in 3 days to get her cbc rechecked, she is completely asymptomatic but she has an elevated white count of 14 which I cannot explain at this time. We have pending labs still to review. She does have poor dentition and if the wbc is elevated on repeat cbc I amy consider her dental caries as source of leukocytosis. She will return for labs only cbc on Sunday.  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 Needs mammogram referral Annual labs pending Refilled meds for diabetes. Iadvise patient that she needs better diabetes control. F/u prn or in 3 months but this is pending repeat CBC.      Hamilton Capri PHUONG, DO 06/22/2012 8:03 PM

## 2012-06-23 LAB — COMPREHENSIVE METABOLIC PANEL WITH GFR
ALT: 54 U/L — ABNORMAL HIGH (ref 0–35)
AST: 29 U/L (ref 0–37)
Alkaline Phosphatase: 104 U/L (ref 39–117)
Chloride: 101 meq/L (ref 96–112)
Creat: 0.69 mg/dL (ref 0.50–1.10)
Total Bilirubin: 0.4 mg/dL (ref 0.3–1.2)

## 2012-06-23 LAB — COMPREHENSIVE METABOLIC PANEL
Albumin: 4.4 g/dL (ref 3.5–5.2)
BUN: 17 mg/dL (ref 6–23)
CO2: 28 mEq/L (ref 19–32)
Calcium: 10.1 mg/dL (ref 8.4–10.5)
Glucose, Bld: 184 mg/dL — ABNORMAL HIGH (ref 70–99)
Potassium: 4.3 mEq/L (ref 3.5–5.3)
Sodium: 139 mEq/L (ref 135–145)
Total Protein: 7.6 g/dL (ref 6.0–8.3)

## 2012-06-23 LAB — LIPID PANEL
Cholesterol: 185 mg/dL (ref 0–200)
HDL: 40 mg/dL (ref >39)
LDL Cholesterol: 124 mg/dL — ABNORMAL HIGH (ref 0–99)
Total CHOL/HDL Ratio: 4.6 Ratio
Triglycerides: 103 mg/dL (ref <150)
VLDL: 21 mg/dL (ref 0–40)

## 2012-06-23 LAB — TSH: TSH: 0.694 u[IU]/mL (ref 0.350–4.500)

## 2012-06-24 ENCOUNTER — Ambulatory Visit: Payer: BC Managed Care – PPO

## 2012-06-25 ENCOUNTER — Telehealth: Payer: Self-pay | Admitting: Family Medicine

## 2012-06-25 LAB — PAP IG W/ RFLX HPV ASCU

## 2012-06-25 NOTE — Telephone Encounter (Signed)
Unable to leave message. Called home number but is actually daughter's number. Aked her son-in-law to ask her to call me regarding labs. She never returned for a repeat CBC. It was elevated at 14.2. She was asymptomatic. Her labs were unremarkable except for slightly elevated AST.

## 2012-06-26 ENCOUNTER — Other Ambulatory Visit (INDEPENDENT_AMBULATORY_CARE_PROVIDER_SITE_OTHER): Payer: BC Managed Care – PPO

## 2012-06-26 ENCOUNTER — Telehealth: Payer: Self-pay | Admitting: Family Medicine

## 2012-06-26 DIAGNOSIS — J322 Chronic ethmoidal sinusitis: Secondary | ICD-10-CM

## 2012-06-26 DIAGNOSIS — Z1231 Encounter for screening mammogram for malignant neoplasm of breast: Secondary | ICD-10-CM

## 2012-06-26 DIAGNOSIS — D72829 Elevated white blood cell count, unspecified: Secondary | ICD-10-CM

## 2012-06-26 DIAGNOSIS — K029 Dental caries, unspecified: Secondary | ICD-10-CM

## 2012-06-26 LAB — POCT CBC
Granulocyte percent: 66.5 %G (ref 37–80)
MCH, POC: 27.6 pg (ref 27–31.2)
MCV: 87.7 fL (ref 80–97)
MID (cbc): 0.9 (ref 0–0.9)
MPV: 9.3 fL (ref 0–99.8)
POC LYMPH PERCENT: 27.5 %L (ref 10–50)
POC MID %: 6 %M (ref 0–12)
Platelet Count, POC: 358 10*3/uL (ref 142–424)
RBC: 4.97 M/uL (ref 4.04–5.48)
RDW, POC: 13.4 %
WBC: 15.7 10*3/uL — AB (ref 4.6–10.2)

## 2012-06-26 MED ORDER — AMOXICILLIN-POT CLAVULANATE 875-125 MG PO TABS: 1.0000 | ORAL_TABLET | Freq: Two times a day (BID) | ORAL | Status: DC

## 2012-06-26 NOTE — Telephone Encounter (Signed)
Spoke with patient using daughter's cell number [phone]. She is asymptomatic but has had recent sinus infection. On exam in office her dentition was poor. I will start her on Augmenting since repeat CBC is higher at 15.2 from 14. There is a slight shift in grans and lymphs. She has been dadvise to return either on Sat/Sunday to see me for recheck. Also d/w her labs.

## 2012-06-27 ENCOUNTER — Other Ambulatory Visit: Payer: Self-pay | Admitting: Radiology

## 2012-06-27 DIAGNOSIS — Z1239 Encounter for other screening for malignant neoplasm of breast: Secondary | ICD-10-CM

## 2012-06-27 DIAGNOSIS — Z1231 Encounter for screening mammogram for malignant neoplasm of breast: Secondary | ICD-10-CM

## 2012-07-30 ENCOUNTER — Encounter: Payer: Self-pay | Admitting: Family Medicine

## 2013-05-09 ENCOUNTER — Emergency Department (HOSPITAL_BASED_OUTPATIENT_CLINIC_OR_DEPARTMENT_OTHER)
Admission: EM | Admit: 2013-05-09 | Discharge: 2013-05-09 | Disposition: A | Discharge status: Home / Self Care | Payer: Self-pay | Attending: Emergency Medicine | Admitting: Emergency Medicine

## 2013-05-09 ENCOUNTER — Encounter (HOSPITAL_BASED_OUTPATIENT_CLINIC_OR_DEPARTMENT_OTHER): Payer: Self-pay | Admitting: Emergency Medicine

## 2013-05-09 ENCOUNTER — Emergency Department (HOSPITAL_BASED_OUTPATIENT_CLINIC_OR_DEPARTMENT_OTHER): Payer: Self-pay

## 2013-05-09 DIAGNOSIS — Y9301 Activity, walking, marching and hiking: Secondary | ICD-10-CM | POA: Insufficient documentation

## 2013-05-09 DIAGNOSIS — Y929 Unspecified place or not applicable: Secondary | ICD-10-CM | POA: Insufficient documentation

## 2013-05-09 DIAGNOSIS — S8990XA Unspecified injury of unspecified lower leg, initial encounter: Secondary | ICD-10-CM | POA: Insufficient documentation

## 2013-05-09 DIAGNOSIS — Z79899 Other long term (current) drug therapy: Secondary | ICD-10-CM | POA: Insufficient documentation

## 2013-05-09 DIAGNOSIS — W1809XA Striking against other object with subsequent fall, initial encounter: Secondary | ICD-10-CM | POA: Insufficient documentation

## 2013-05-09 DIAGNOSIS — Z792 Long term (current) use of antibiotics: Secondary | ICD-10-CM | POA: Insufficient documentation

## 2013-05-09 DIAGNOSIS — E119 Type 2 diabetes mellitus without complications: Secondary | ICD-10-CM | POA: Insufficient documentation

## 2013-05-09 DIAGNOSIS — M25562 Pain in left knee: Secondary | ICD-10-CM

## 2013-05-09 MED ORDER — TRAMADOL HCL 50 MG PO TABS
50.0000 mg | ORAL_TABLET | Freq: Once | ORAL | Status: AC
  Administered 2013-05-09: 50 mg via ORAL
  Filled 2013-05-09: qty 1

## 2013-05-09 MED ORDER — TRAMADOL HCL 50 MG PO TABS: 50.0000 mg | ORAL_TABLET | Freq: Four times a day (QID) | ORAL | Status: DC | PRN

## 2013-05-09 NOTE — ED Notes (Signed)
Pt sts she fell 6days ago, injuring Left knee, +swelling. Shoe tripped on crack in concrete.

## 2013-05-09 NOTE — ED Provider Notes (Signed)
CSN: [id number]     Arrival date & time 05/09/13  9629 History  This chart was scribed for Shelda Jakes, MD by Ardelia Mems, ED Scribe. This patient was seen in room MH08/MH08 and the patient's care was started at 9:27 PM.   Chief Complaint  Patient presents with  . Knee Pain    Patient is a 52 y.o. female presenting with knee pain. The history is provided by the patient. No language interpreter was used.  Knee Pain Location:  Knee Time since incident:  6 days Injury: yes   Mechanism of injury: fall   Fall:    Fall occurred:  Walking   Impact surface:  Primary school teacher of impact:  Knees   Entrapped after fall: no   Knee location:  L knee Pain details:    Quality: "soreness"   Radiates to:  Does not radiate   Severity:  Moderate   Onset quality:  Gradual   Duration:  6 days   Timing:  Intermittent   Progression:  Worsening Chronicity:  New Dislocation: no   Foreign body present:  No foreign bodies Prior injury to area:  No Worsened by:  Bearing weight Ineffective treatments:  None tried Associated symptoms: no back pain, no fever and no neck pain     HPI Comments: [name] is a 52 y.o. female with a history of DM ho presents to the Emergency Department complaining of a left knee injury that occurred 6 days ago. Pt states that she injured the knee when she tripped while walking, and she hit her left knee on concrete upon falling. She states that she has been having a gradual onset of persistent left knee pain since the fall with associated left knee swelling. She describes her pain as "soreness". She states that she has not been evaluated for this pain. She states that she has been able to walk normally since the fall. She denies hip pain, head injury or any pain or symptoms.  PCP- Dr. Nilda Simmer   Past Medical History  Diagnosis Date  . Diabetes mellitus without complication    Past Surgical History  Procedure Laterality Date  . Appendectomy      Family History  Problem Relation Age of Onset  . Anemia Mother   . Diabetes Father   . Hypertension Daughter    History  Substance Use Topics  . Smoking status: Never Smoker   . Smokeless tobacco: Not on file  . Alcohol Use: No   OB History   Grav Para Term Preterm Abortions TAB SAB Ect Mult Living                 Review of Systems  Constitutional: Negative for fever.  HENT: Negative for congestion, rhinorrhea and sore throat.   Eyes: Negative for visual disturbance.  Respiratory: Negative for cough and shortness of breath.   Cardiovascular: Negative for chest pain.  Gastrointestinal: Negative for nausea, vomiting, abdominal pain and diarrhea.  Genitourinary: Negative for dysuria.  Musculoskeletal: Positive for arthralgias (left knee) and joint swelling (left knee). Negative for back pain, gait problem and neck pain.       Denies hip pain.  Skin: Negative for rash.  Neurological: Negative for headaches.  Psychiatric/Behavioral: Negative for confusion.   Allergies  Review of patient's allergies indicates no known allergies.  Home Medications   Current Outpatient Rx  Name  Route  Sig  Dispense  Refill  . amoxicillin-clavulanate (AUGMENTIN) 875-125 MG per tablet  Oral   Take 1 tablet by mouth 2 (two) times daily.   20 tablet   0   . glipiZIDE (GLUCOTROL) 10 MG tablet   Oral   Take 1 tablet (10 mg total) by mouth 2 (two) times daily before a meal.   60 tablet   3   . lisinopril (PRINIVIL,ZESTRIL) 10 MG tablet   Oral   Take 1 tablet (10 mg total) by mouth daily.   30 tablet   3   . metFORMIN (GLUCOPHAGE) 1000 MG tablet   Oral   Take 1 tablet (1,000 mg total) by mouth 2 (two) times daily with a meal.   60 tablet   3   . traMADol (ULTRAM) 50 MG tablet   Oral   Take 1 tablet (50 mg total) by mouth every 6 (six) hours as needed.   15 tablet   0    Triage Vitals: BP 184/83  Pulse 97  Temp(Src) 98.4 F (36.9 C) (Oral)  Resp 16  Ht 5\' 1"  (1.549 m)   Wt 190 lb (86.183 kg)  BMI 35.92 kg/m2  SpO2 100%  Physical Exam  Nursing note and vitals reviewed. Constitutional: She is oriented to person, place, and time. She appears well-developed and well-nourished. No distress.  HENT:  Head: Normocephalic and atraumatic.  Eyes: EOM are normal.  Neck: Neck supple. No tracheal deviation present.  Cardiovascular: Normal rate and regular rhythm.   Left dorsalis pedis pulse is 2+.  Pulmonary/Chest: Effort normal and breath sounds normal. No respiratory distress. She has no wheezes. She has no rales. She exhibits no tenderness.  Abdominal: Soft. Bowel sounds are normal. There is no tenderness.  Musculoskeletal: Normal range of motion. She exhibits tenderness.  Slight swelling of left knee compared to right knee. Tenderness over patella. Knee cap properly positioned. No effusion. Joint line and collateral lateral ligaments normal.  Neurological: She is alert and oriented to person, place, and time.  Skin: Skin is warm and dry.  Psychiatric: She has a normal mood and affect. Her behavior is normal.    ED Course  Procedures (including critical care time)  DIAGNOSTIC STUDIES: Oxygen Saturation is 100% on RA, normal by my interpretation.    COORDINATION OF CARE: 9:35 PM- Discussed normal radiology findings. Will order Tramadol while in the ED and discharge with a prescription for Tramadol. Pt advised of plan for treatment and pt agrees.  Medications  traMADol (ULTRAM) tablet 50 mg (not administered)   Labs Review Labs Reviewed - No data to display Imaging Review Dg Knee Complete 4 Views Left  05/09/2013   CLINICAL DATA:  Fall.  Knee pain.  Patellar pain.  EXAM: LEFT KNEE - COMPLETE 4+ VIEW  COMPARISON:  None.  FINDINGS: Anatomic alignment of the left knee. There is no displaced fracture identified. Patellar enthesopathy is present. Mild to moderate medial and lateral compartment osteoarthritis. Moderate patellofemoral osteoarthritis. There is no  knee effusion. Prepatellar soft tissues appear within normal limits.  IMPRESSION: No acute osseous abnormality.  Osteoarthritis of the knee.   Electronically Signed   By: Andreas Newport M.D.   On: 05/09/2013 19:54    EKG Interpretation   None       MDM   1. Knee pain, acute, left    The patient status post fall landing on the anterior part of her knee about 6 days ago. X-rays negative for any bony injuries no evidence of any significant effusion. Some slight swelling noted. Patient able to ambulate fine. Told  patient to get another 7 days it hasn't healed and referral to sports medicine provided. We'll treat with tramadol. Patient says she does not need any brace.  I personally performed the services described in this documentation, which was scribed in my presence. The recorded information has been reviewed and is accurate.     Shelda Jakes, MD 05/09/13 2200

## 2014-10-17 ENCOUNTER — Encounter: Payer: Self-pay | Admitting: Gastroenterology

## 2016-07-25 ENCOUNTER — Other Ambulatory Visit: Payer: Self-pay | Admitting: Obstetrics & Gynecology

## 2016-07-26 LAB — CYTOLOGY - PAP

## 2017-01-18 ENCOUNTER — Telehealth: Payer: Self-pay | Admitting: Hematology and Oncology

## 2017-01-18 ENCOUNTER — Encounter: Payer: Self-pay | Admitting: Hematology and Oncology

## 2017-01-18 NOTE — Telephone Encounter (Signed)
Appt has been scheduled for the pt to see Dr. Jamie BrookesPerlob on 9/18 at 2pm. Scheduled with Asher MuirJamie from the referring office. Letter mailed to the pt.

## 2017-01-31 ENCOUNTER — Encounter: Payer: Self-pay | Admitting: Hematology and Oncology

## 2017-02-14 ENCOUNTER — Telehealth: Payer: Self-pay | Admitting: Internal Medicine

## 2017-02-14 NOTE — Telephone Encounter (Signed)
Pt cld to reschedule appt. Appt has been rescheduled to see Dr. Arbutus Ped on 10/15 at 1130am. Pt aware to arrive 30 minutes early.

## 2017-02-27 ENCOUNTER — Encounter: Payer: Self-pay | Admitting: Internal Medicine

## 2017-02-27 ENCOUNTER — Ambulatory Visit (HOSPITAL_BASED_OUTPATIENT_CLINIC_OR_DEPARTMENT_OTHER): Payer: 59

## 2017-02-27 ENCOUNTER — Other Ambulatory Visit: Payer: Self-pay | Admitting: Internal Medicine

## 2017-02-27 ENCOUNTER — Ambulatory Visit (HOSPITAL_BASED_OUTPATIENT_CLINIC_OR_DEPARTMENT_OTHER): Payer: 59 | Admitting: Internal Medicine

## 2017-02-27 ENCOUNTER — Ambulatory Visit: Payer: 59

## 2017-02-27 VITALS — BP 156/81 | HR 96 | Temp 98.2°F | Resp 18 | Ht 61.0 in | Wt 168.7 lb

## 2017-02-27 DIAGNOSIS — I10 Essential (primary) hypertension: Secondary | ICD-10-CM

## 2017-02-27 DIAGNOSIS — D72829 Elevated white blood cell count, unspecified: Secondary | ICD-10-CM | POA: Insufficient documentation

## 2017-02-27 DIAGNOSIS — Z832 Family history of diseases of the blood and blood-forming organs and certain disorders involving the immune mechanism: Secondary | ICD-10-CM

## 2017-02-27 DIAGNOSIS — E119 Type 2 diabetes mellitus without complications: Secondary | ICD-10-CM | POA: Diagnosis not present

## 2017-02-27 HISTORY — DX: Elevated white blood cell count, unspecified: D72.829

## 2017-02-27 LAB — CBC WITH DIFFERENTIAL/PLATELET
BASO%: 0.5 % (ref 0.0–2.0)
Basophils Absolute: 0.1 10*3/uL (ref 0.0–0.1)
EOS%: 1.4 % (ref 0.0–7.0)
Eosinophils Absolute: 0.2 10*3/uL (ref 0.0–0.5)
HEMATOCRIT: 37.9 % (ref 34.8–46.6)
HGB: 12.3 g/dL (ref 11.6–15.9)
LYMPH%: 25 % (ref 14.0–49.7)
MCH: 27.8 pg (ref 25.1–34.0)
MCHC: 32.5 g/dL (ref 31.5–36.0)
MCV: 85.4 fL (ref 79.5–101.0)
MONO#: 1.1 10*3/uL — AB (ref 0.1–0.9)
MONO%: 7.9 % (ref 0.0–14.0)
NEUT#: 9.5 10*3/uL — ABNORMAL HIGH (ref 1.5–6.5)
NEUT%: 65.2 % (ref 38.4–76.8)
PLATELETS: 306 10*3/uL (ref 145–400)
RBC: 4.43 10*6/uL (ref 3.70–5.45)
RDW: 13.6 % (ref 11.2–14.5)
WBC: 14.5 10*3/uL — ABNORMAL HIGH (ref 3.9–10.3)
lymph#: 3.6 10*3/uL — ABNORMAL HIGH (ref 0.9–3.3)

## 2017-02-27 LAB — COMPREHENSIVE METABOLIC PANEL
ALT: 50 U/L (ref 0–55)
AST: 28 U/L (ref 5–34)
Albumin: 3.8 g/dL (ref 3.5–5.0)
Alkaline Phosphatase: 85 U/L (ref 40–150)
Anion Gap: 9 mEq/L (ref 3–11)
BUN: 27.2 mg/dL — ABNORMAL HIGH (ref 7.0–26.0)
CALCIUM: 10.2 mg/dL (ref 8.4–10.4)
CHLORIDE: 101 meq/L (ref 98–109)
CO2: 28 meq/L (ref 22–29)
Creatinine: 1.1 mg/dL (ref 0.6–1.1)
Glucose: 206 mg/dl — ABNORMAL HIGH (ref 70–140)
POTASSIUM: 4.3 meq/L (ref 3.5–5.1)
Sodium: 139 mEq/L (ref 136–145)
Total Bilirubin: 0.51 mg/dL (ref 0.20–1.20)
Total Protein: 7.9 g/dL (ref 6.4–8.3)

## 2017-02-27 LAB — LACTATE DEHYDROGENASE: LDH: 227 U/L (ref 125–245)

## 2017-02-27 NOTE — Progress Notes (Signed)
[facility] [phone]   [phone]  CONSULT NOTE  REFERRING PHYSICIAN: Hulen Luster, PA  REASON FOR CONSULTATION:  56 years old African-American female with persistent leukocytosis.  HPI Katrenia Alkins is a 56 y.o. female with past medical history significant only for diabetes mellitus, dyslipidemia and hypertension. The patient was seen recently by her primary care physician for routine evaluation and repeat CBC on 11/24/2016 showed persistent leukocytosis with elevated white blood count of 13.0 and absolute neutrophil count of 8500. The patient was referred to me today for evaluation and recommendation regarding her condition. Reviewing her medical records, I noticed that the patient had persistent leukocytosis for at least the last 8 years. On 07/29/2008 her total white blood count was 27.5. On 06/26/2012 total white blood count was 15.7 absolute neutrophil count of 10,400.  The patient denied having any recent infection or inflammatory condition. She denied having any urinary tract infection, upper respiratory infection. She denied having any recent treatment with steroids. She has no bleeding disorder or palpable lymphadenopathy. She has not taken any hormonal treatment. The patient denied having any recent weight loss or night sweats. She denied having any arthralgia or myalgia. Review of systems she has no nausea, vomiting, diarrhea or constipation. She denied having any chest pain, shortness breath, cough or hemoptysis. She has no headache or visual changes. Family history significant for mother with anemia and father with diabetes mellitus. There is no family history of leukemia or blood disorders. The patient is single and has 2 daughters. She works at a daycare. She has no history of smoking, alcohol or drug abuse.  HPI  Past Medical History:  Diagnosis Date  . Diabetes mellitus without complication (HCC)   . Leukocytosis 02/27/2017     Past Surgical History:  Procedure Laterality Date  . APPENDECTOMY      Family History  Problem Relation Age of Onset  . Anemia Mother   . Diabetes Father   . Hypertension Daughter     Social History Social History  Substance Use Topics  . Smoking status: Never Smoker  . Smokeless tobacco: Never Used  . Alcohol use No    Allergies  Allergen Reactions  . Latex Itching    Itching, redness    Current Outpatient Prescriptions  Medication Sig Dispense Refill  . glipiZIDE (GLUCOTROL) 10 MG tablet Take 1 tablet (10 mg total) by mouth 2 (two) times daily before a meal. 60 tablet 3  . insulin aspart protamine- aspart (NOVOLOG MIX 70/30) (70-30) 100 UNIT/ML injection Inject 18 Units into the skin daily with supper.    Marland Kitchen lisinopril (PRINIVIL,ZESTRIL) 10 MG tablet Take 1 tablet (10 mg total) by mouth daily. 30 tablet 3  . metFORMIN (GLUCOPHAGE) 1000 MG tablet Take 1 tablet (1,000 mg total) by mouth 2 (two) times daily with a meal. 60 tablet 3   No current facility-administered medications for this visit.     Review of Systems  Constitutional: negative Eyes: negative Ears, nose, mouth, throat, and face: negative Respiratory: negative Cardiovascular: negative Gastrointestinal: negative Genitourinary:negative Integument/breast: negative Hematologic/lymphatic: negative Musculoskeletal:negative Neurological: negative Behavioral/Psych: negative Endocrine: negative Allergic/Immunologic: negative  Physical Exam  [id number], healthy, no distress, well nourished and well developed SKIN: skin color, texture, turgor are normal, no rashes or significant lesions HEAD: Normocephalic, No masses, lesions, tenderness or abnormalities EYES: normal, PERRLA, Conjunctiva are pink and non-injected EARS: External ears normal, Canals clear OROPHARYNX:no exudate, no erythema and lips, buccal mucosa, and tongue normal  NECK:  supple, no adenopathy, no JVD LYMPH:  no palpable  lymphadenopathy, no hepatosplenomegaly BREAST:not examined LUNGS: clear to auscultation , and palpation HEART: regular rate & rhythm, no murmurs and no gallops ABDOMEN:abdomen soft, non-tender, normal bowel sounds and no masses or organomegaly BACK: No CVA tenderness, Range of motion is normal EXTREMITIES:no joint deformities, effusion, or inflammation, no edema, no skin discoloration  NEURO: alert & oriented x 3 with fluent speech, no focal motor/sensory deficits  PERFORMANCE STATUS: ECOG 1  LABORATORY DATA: Lab Results  Component Value Date   WBC 15.7 (A) 06/26/2012   HGB 13.7 06/26/2012   HCT 43.6 06/26/2012   MCV 87.7 06/26/2012   PLT 307 09/26/2008      Chemistry      Component Value Date/Time   NA 139 06/22/2012 1947   K 4.3 06/22/2012 1947   CL 101 06/22/2012 1947   CO2 28 06/22/2012 1947   BUN 17 06/22/2012 1947   CREATININE 0.69 06/22/2012 1947      Component Value Date/Time   CALCIUM 10.1 06/22/2012 1947   ALKPHOS 104 06/22/2012 1947   AST 29 06/22/2012 1947   ALT 54 (H) 06/22/2012 1947   BILITOT 0.4 06/22/2012 1947       RADIOGRAPHIC STUDIES: No results found.  ASSESSMENT: This is a very pleasant 56 years old African-American female with persistent leukocytosis that has been going on for several years. This is likely reactive in origin but I cannot rule out any underlying myeloproliferative disorder at this point.   PLAN: I had a lengthy discussion with the patient today about her condition. I recommended for her to have repeat CBC, comprehensive metabolic panel and LDH today. I will also arrange for the patient to have molecular studies for BCR/ABLas well as leukocyte alkaline phosphatase. I will arrange for the patient to come back for follow-up visit in 2 weeks for reevaluation and discussion of her pending lab results and further recommendation regarding her condition. The patient is currently asymptomatic and I recommended for her to continue on  observation for now. For the diabetes mellitus and hypertension, the patient will continue with her current home medications. The patient was advised to call immediately if she has any concerning symptoms in the interval. The patient voices understanding of current disease status and treatment options and is in agreement with the current care plan.  All questions were answered. The patient knows to call the clinic with any problems, questions or concerns. We can certainly see the patient much sooner if necessary.  Thank you so much for allowing me to participate in the care of [facility]. I will continue to follow up the patient with you and assist in her care.  I spent 40 minutes counseling the patient face to face. The total time spent in the appointment was 60 minutes.  Disclaimer: This note was dictated with voice recognition software. Similar sounding words can inadvertently be transcribed and may not be corrected upon review.   Lajuana Matte February 27, 2017, 11:48 AM

## 2017-02-28 ENCOUNTER — Telehealth: Payer: Self-pay | Admitting: Internal Medicine

## 2017-02-28 NOTE — Telephone Encounter (Signed)
Scheduled apt per 10/15 los - left message with appt date and time.

## 2017-03-07 LAB — LEUKOCYTE ALKALINE PHOSPHATASE: Leukocyte Alkaline Phos Score: 119 (ref 25–130)

## 2017-03-15 ENCOUNTER — Ambulatory Visit: Payer: 59 | Admitting: Internal Medicine

## 2017-03-15 ENCOUNTER — Other Ambulatory Visit: Payer: 59

## 2017-08-29 ENCOUNTER — Encounter (HOSPITAL_COMMUNITY): Payer: Self-pay

## 2017-08-29 ENCOUNTER — Other Ambulatory Visit: Payer: Self-pay

## 2017-08-29 ENCOUNTER — Emergency Department (HOSPITAL_COMMUNITY)
Admission: EM | Admit: 2017-08-29 | Discharge: 2017-08-29 | Disposition: A | Discharge status: Home / Self Care | Payer: 59 | Attending: Emergency Medicine | Admitting: Emergency Medicine

## 2017-08-29 DIAGNOSIS — M62830 Muscle spasm of back: Secondary | ICD-10-CM

## 2017-08-29 DIAGNOSIS — M545 Low back pain: Secondary | ICD-10-CM | POA: Diagnosis present

## 2017-08-29 HISTORY — DX: Essential (primary) hypertension: I10

## 2017-08-29 MED ORDER — CYCLOBENZAPRINE HCL 10 MG PO TABS: 5.0000 mg | ORAL_TABLET | Freq: Every day | ORAL | 0 refills | Status: DC

## 2017-08-29 MED ORDER — CYCLOBENZAPRINE HCL 10 MG PO TABS: 5.0000 mg | ORAL_TABLET | Freq: Every day | ORAL | 0 refills | Status: AC

## 2017-08-29 NOTE — ED Triage Notes (Signed)
Patient c/o left lower back pain x 5 days. Patient states she bent over to tie her shoes and that is when her back started tightening up. Patient c/o the pain radiating down her left leg.

## 2017-08-29 NOTE — Discharge Instructions (Signed)
You may use over-the-counter Motrin (Ibuprofen), Acetaminophen (Tylenol), topical muscle creams such as SalonPas, Icy Hot, Bengay, etc. Please stretch, apply heat, and have massage therapy for additional assistance. ° °

## 2017-08-29 NOTE — ED Provider Notes (Addendum)
[facility]-EMERGENCY DEPT Provider Note  CSN: [id number] Arrival date & time: 08/29/17 0701  Chief Complaint(s) Back Pain  HPI [name] is a 57 y.o. female   The history is provided by the patient.  Back Pain   This is a new problem. Episode onset: 5 days. Episode frequency: intermittent. The problem has not changed since onset.Associated with: bent over to tie her shoes when pain started. The pain is present in the lumbar spine and sacro-iliac joint. The quality of the pain is described as aching. The pain radiates to the left thigh. The pain is moderate. The symptoms are aggravated by bending, twisting and certain positions. Pertinent negatives include no numbness, no bowel incontinence, no perianal numbness, no bladder incontinence, no dysuria and no tingling. She has tried NSAIDs for the symptoms. The treatment provided mild relief.    Past Medical History Past Medical History:  Diagnosis Date  . Diabetes mellitus without complication (HCC)   . Hypertension   . Leukocytosis 02/27/2017   Patient Active Problem List   Diagnosis Date Noted  . Leukocytosis 02/27/2017  . NONSPECIFIC ABN FINDING RAD & OTH EXAM GI TRACT 09/17/2008  . DM 09/16/2008  . OBESITY 09/16/2008  . HYPERTENSION 09/16/2008   Home Medication(s) Prior to Admission medications   Medication Sig Start Date End Date Taking? Authorizing Provider  cyclobenzaprine (FLEXERIL) 10 MG tablet Take 0.5-1 tablets (5-10 mg total) by mouth at bedtime for 10 days. 08/29/17 09/08/17  Nira Conn, MD  glipiZIDE (GLUCOTROL) 10 MG tablet Take 1 tablet (10 mg total) by mouth 2 (two) times daily before a meal. 06/22/12   Le, Thao P, DO  insulin aspart protamine- aspart (NOVOLOG MIX 70/30) (70-30) 100 UNIT/ML injection Inject 18 Units into the skin daily with supper.    [provider]  lisinopril (PRINIVIL,ZESTRIL) 10 MG tablet Take 1 tablet (10 mg total) by mouth daily. 06/22/12   Le, Thao  P, DO  metFORMIN (GLUCOPHAGE) 1000 MG tablet Take 1 tablet (1,000 mg total) by mouth 2 (two) times daily with a meal. 06/22/12   Le, Rachelle Hora, DO                                                                                                                                    Past Surgical History Past Surgical History:  Procedure Laterality Date  . APPENDECTOMY     Family History Family History  Problem Relation Age of Onset  . Anemia Mother   . Diabetes Father   . Hypertension Daughter     Social History Social History   Tobacco Use  . Smoking status: Never Smoker  . Smokeless tobacco: Never Used  Substance Use Topics  . Alcohol use: No  . Drug use: No   Allergies Latex  Review of Systems Review of Systems  Gastrointestinal: Negative for bowel incontinence.  Genitourinary: Negative for bladder incontinence and dysuria.  Musculoskeletal:  Positive for back pain.  Neurological: Negative for tingling and numbness.   All other systems are reviewed and are negative for acute change except as noted in the HPI  Physical Exam Vital Signs  I have reviewed the triage vital signs BP (!) 171/81 (BP Location: Right Arm)   Pulse 96   Temp 98.5 F (36.9 C) (Oral)   Resp 16   Ht 5\' 1"  (1.549 m)   Wt 78 kg (172 lb)   SpO2 94%   BMI 32.50 kg/m   Physical Exam  Constitutional: She is oriented to person, place, and time. She appears well-developed and well-nourished. No distress.  HENT:  Head: Normocephalic and atraumatic.  Right Ear: External ear normal.  Left Ear: External ear normal.  Nose: Nose normal.  Eyes: Conjunctivae and EOM are normal. No scleral icterus.  Neck: Normal range of motion and phonation normal.  Cardiovascular: Normal rate and regular rhythm.  Pulmonary/Chest: Effort normal. No stridor. No respiratory distress.  Abdominal: She exhibits no distension.  Musculoskeletal: Normal range of motion. She exhibits no edema.       Lumbar back: She exhibits  tenderness. She exhibits no bony tenderness.       Back:  Neurological: She is alert and oriented to person, place, and time.  Spine Exam: Strength: 5/5 throughout LE bilaterally (hip flexion/extension, adduction/abduction; knee flexion/extension; foot dorsiflexion/plantarflexion, inversion/eversion; great toe inversion) Sensation: Intact to light touch in proximal and distal LE bilaterally Reflexes: 1+ quadriceps and achilles reflexes   Skin: She is not diaphoretic.  Psychiatric: She has a normal mood and affect. Her behavior is normal.  Vitals reviewed.   ED Results and Treatments Labs (all labs ordered are listed, but only abnormal results are displayed) Labs Reviewed - No data to display                                                                                                                       EKG  EKG Interpretation  Date/Time:    Ventricular Rate:    PR Interval:    QRS Duration:   QT Interval:    QTC Calculation:   R Axis:     Text Interpretation:        Radiology No results found. Pertinent labs & imaging results that were available during my care of the patient were reviewed by me and considered in my medical decision making (see chart for details).  Medications Ordered in ED Medications - No data to display  Procedures Procedures  (including critical care time)  Medical Decision Making / ED Course I have reviewed the nursing notes for this encounter and the patient's prior records (if available in EHR or on provided paperwork).    57 y.o. female presents with back pain in lumbar area for 2 days. No acute traumatic onset. No red flag symptoms of fever, weight loss, saddle anesthesia, weakness, fecal/urinary incontinence or urinary retention.   Suspect MSK etiology. No indication for imaging emergently. Patient was  recommended to take short course of scheduled NSAIDs and engage in early mobility as definitive treatment. Return precautions discussed for worsening or new concerning symptoms.    Final Clinical Impression(s) / ED Diagnoses Final diagnoses:  Muscle spasm of back   Disposition: Discharge  Condition: Good  I have discussed the results, Dx and Tx plan with the patient who expressed understanding and agree(s) with the plan. Discharge instructions discussed at great length. The patient was given strict return precautions who verbalized understanding of the instructions. No further questions at time of discharge.    ED Discharge Orders        Ordered    cyclobenzaprine (FLEXERIL) 10 MG tablet  Daily at bedtime     08/29/17 0847       Follow Up: Ethelda Chick, MD [address] [place] [address] [phone]   in 1 week, If symptoms do not improve or  worsen      This chart was dictated using voice recognition software.  Despite best efforts to proofread,  errors can occur which can change the documentation meaning.      Nira Conn, MD 08/29/17 [phone]

## 2018-06-11 ENCOUNTER — Encounter: Payer: Self-pay | Admitting: Family Medicine

## 2018-06-11 ENCOUNTER — Ambulatory Visit: Payer: Self-pay | Attending: Family Medicine | Admitting: Family Medicine

## 2018-06-11 VITALS — BP 135/82 | HR 94 | Temp 99.0°F | Resp 18 | Ht 61.0 in | Wt 175.0 lb

## 2018-06-11 DIAGNOSIS — E669 Obesity, unspecified: Secondary | ICD-10-CM

## 2018-06-11 DIAGNOSIS — Z79899 Other long term (current) drug therapy: Secondary | ICD-10-CM | POA: Insufficient documentation

## 2018-06-11 DIAGNOSIS — I10 Essential (primary) hypertension: Secondary | ICD-10-CM

## 2018-06-11 DIAGNOSIS — Z794 Long term (current) use of insulin: Secondary | ICD-10-CM

## 2018-06-11 DIAGNOSIS — Z833 Family history of diabetes mellitus: Secondary | ICD-10-CM | POA: Insufficient documentation

## 2018-06-11 DIAGNOSIS — Z6833 Body mass index (BMI) 33.0-33.9, adult: Secondary | ICD-10-CM

## 2018-06-11 DIAGNOSIS — E119 Type 2 diabetes mellitus without complications: Secondary | ICD-10-CM

## 2018-06-11 LAB — POCT GLYCOSYLATED HEMOGLOBIN (HGB A1C): Hemoglobin A1C: 7.6 % — AB (ref 4.0–5.6)

## 2018-06-11 LAB — GLUCOSE, POCT (MANUAL RESULT ENTRY): POC Glucose: 114 mg/dL — AB (ref 70–99)

## 2018-06-11 MED ORDER — BASAGLAR KWIKPEN 100 UNIT/ML ~~LOC~~ SOPN: PEN_INJECTOR | SUBCUTANEOUS | 5 refills | Status: DC

## 2018-06-11 MED ORDER — METFORMIN HCL 1000 MG PO TABS: 1000.0000 mg | ORAL_TABLET | Freq: Two times a day (BID) | ORAL | 3 refills | Status: DC

## 2018-06-11 MED ORDER — LISINOPRIL 10 MG PO TABS: 10.0000 mg | ORAL_TABLET | Freq: Every day | ORAL | 3 refills | Status: DC

## 2018-06-11 MED ORDER — GLIPIZIDE 10 MG PO TABS: 10.0000 mg | ORAL_TABLET | Freq: Two times a day (BID) | ORAL | 3 refills | Status: DC

## 2018-06-11 NOTE — Progress Notes (Signed)
Subjective:    Patient ID: Kirsten BlamerKatherine Alsobrook, female    DOB: 1960/10/22, 58 y.o.   MRN: [id number]  HPI      58 yo female new to the practice.  Patient has type 2 diabetes for which she is currently on oral medications as well as insulin.  Patient reports that she has no complications secondary to her diabetes.  Patient states that her fasting blood sugars are generally 160 or less and usually closer to the 140s.  Patient has had no increased thirst, no urinary frequency and no blurred vision.  Patient has had no issues with side effects from her medications.  Patient also with hypertension for which she takes lisinopril.  Patient denies any headaches or dizziness related to her blood pressure and patient has had no issues with dry cough secondary to use of lisinopril.  Patient is not sure if she has ever been on a statin medication.        Patient reports that her past medical history is only significant for hypertension and diabetes.  Patient has had prior appendectomy.  Patient does not smoke or use any tobacco products.  Patient is a Administrator, sportsdaycare worker.  Patient reports a family history is significant for father with diabetes and father passed away from Alzheimer's disease.  Patient has a brother with diabetes as well as 2 daughters with diabetes.  There is no family history of cancer or heart disease.  Patient's only allergy is contact dermatitis with itching and skin irritation with exposure to latex.   Review of Systems  Constitutional: Negative for chills, fatigue and fever.  HENT: Negative for hearing loss and trouble swallowing.   Eyes: Negative for photophobia and visual disturbance.  Respiratory: Negative for cough and shortness of breath.   Cardiovascular: Negative for chest pain, palpitations and leg swelling.  Gastrointestinal: Negative for abdominal pain, blood in stool, constipation, diarrhea and nausea.  Endocrine: Negative for polydipsia, polyphagia and polyuria.  Genitourinary:  Negative for dysuria and frequency.  Musculoskeletal: Negative for back pain and gait problem.  Neurological: Negative for dizziness and headaches.  Hematological: Negative for adenopathy. Does not bruise/bleed easily.       Objective:   Physical Exam Vitals signs and nursing note reviewed.  Constitutional:      General: She is not in acute distress.    Appearance: Normal appearance. She is normal weight.     Comments: Appears slightly overweight for height  HENT:     Head: Normocephalic and atraumatic.     Right Ear: Tympanic membrane normal.     Left Ear: Tympanic membrane normal.     Nose: Nose normal. No congestion or rhinorrhea.     Mouth/Throat:     Mouth: Mucous membranes are moist.     Pharynx: Oropharynx is clear. No oropharyngeal exudate.  Eyes:     Extraocular Movements: Extraocular movements intact.     Conjunctiva/sclera: Conjunctivae normal.  Neck:     Musculoskeletal: Normal range of motion and neck supple. No neck rigidity.     Vascular: No carotid bruit.  Cardiovascular:     Rate and Rhythm: Normal rate and regular rhythm.     Pulses: Normal pulses.          Dorsalis pedis pulses are 2+ on the right side and 2+ on the left side.       Posterior tibial pulses are 2+ on the right side and 2+ on the left side.  Pulmonary:     Effort: Pulmonary  effort is normal.     Breath sounds: Normal breath sounds.  Abdominal:     General: Bowel sounds are normal.     Palpations: Abdomen is soft.     Tenderness: There is no abdominal tenderness. There is no right CVA tenderness or left CVA tenderness.  Musculoskeletal: Normal range of motion.        General: No tenderness or deformity.     Right lower leg: No edema.     Left lower leg: No edema.     Right foot: Normal range of motion. Bunion present. No deformity, Charcot foot, foot drop or prominent metatarsal heads.     Left foot: Normal range of motion. Bunion present. No deformity, Charcot foot, foot drop or prominent  metatarsal heads.  Feet:     Right foot:     Protective Sensation: 10 sites tested. 10 sites sensed.     Skin integrity: No ulcer, blister, skin breakdown, erythema, warmth, callus, dry skin or fissure.     Toenail Condition: Right toenails are normal.     Left foot:     Protective Sensation: 10 sites tested. 10 sites sensed.     Skin integrity: No ulcer, blister, skin breakdown, erythema, warmth, callus, dry skin or fissure.     Toenail Condition: Left toenails are normal.     Comments: Toenails do not appear thickened but patient is wearing nail polish, some nails are borderline long Lymphadenopathy:     Cervical: No cervical adenopathy.  Skin:    General: Skin is warm and dry.     Findings: No rash.  Neurological:     General: No focal deficit present.     Mental Status: She is alert and oriented to person, place, and time.     Cranial Nerves: No cranial nerve deficit.  Psychiatric:        Mood and Affect: Mood normal.        Behavior: Behavior normal.        Thought Content: Thought content normal.        Judgment: Judgment normal.    BP 135/82 (BP Location: Left Arm, Patient Position: Sitting, Cuff Size: Large)   Pulse 94   Temp 99 F (37.2 C) (Oral)   Resp 18   Ht 5\' 1"  (1.549 m)   Wt 175 lb (79.4 kg)   SpO2 99%   BMI 33.07 kg/m         Assessment & Plan:  1. Type 2 diabetes mellitus without complication, with long term current use of insulin  (HCC) Patient with hemoglobin A1c at today's visit of 7.6 indicating that her blood sugars are fairly well controlled.  Would like to see patient closer to 7.0 but without causing issues with hypoglycemia.  Patient is provided with refills for metformin glipizide and Basaglar.  Patient will have CMP and lipid panel at today's visit.  Patient with glucose of 114 and patient reports that she has not eaten since about 7 AM.  Discussed diabetic foot care along with the need for yearly diabetic eye exam. - HgB A1c - Glucose  (CBG) - metFORMIN (GLUCOPHAGE) 1000 MG tablet; Take 1 tablet (1,000 mg total) by mouth 2 (two) times daily with a meal.  Dispense: 60 tablet; Refill: 3 - Insulin Glargine (BASAGLAR KWIKPEN) 100 UNIT/ML SOPN; Inject 24 units into the skin daily  Dispense: 5 pen; Refill: 5 - glipiZIDE (GLUCOTROL) 10 MG tablet; Take 1 tablet (10 mg total) by mouth 2 (two) times daily before  a meal.  Dispense: 60 tablet; Refill: 3 - Comprehensive metabolic panel - Lipid Panel  2. Essential hypertension Patient's blood pressure is reasonably controlled at 135/82 with goal blood pressure of 130/80.  Patient will continue the use of lisinopril as well as a low sodium diet and regular exercise. - lisinopril (PRINIVIL,ZESTRIL) 10 MG tablet; Take 1 tablet (10 mg total) by mouth daily.  Dispense: 30 tablet; Refill: 3  An After Visit Summary was printed and given to the patient.  Return in about 4 months (around 10/10/2018) for DM/HTN-4 or 5 monthd.

## 2018-06-12 LAB — COMPREHENSIVE METABOLIC PANEL WITH GFR
ALT: 31 IU/L (ref 0–32)
AST: 24 IU/L (ref 0–40)
Albumin/Globulin Ratio: 1.2 (ref 1.2–2.2)
Albumin: 4.2 g/dL (ref 3.8–4.9)
Alkaline Phosphatase: 95 IU/L (ref 39–117)
BUN/Creatinine Ratio: 22 (ref 9–23)
BUN: 19 mg/dL (ref 6–24)
Bilirubin Total: 0.2 mg/dL (ref 0.0–1.2)
CO2: 26 mmol/L (ref 20–29)
Calcium: 10.1 mg/dL (ref 8.7–10.2)
Chloride: 102 mmol/L (ref 96–106)
Creatinine, Ser: 0.87 mg/dL (ref 0.57–1.00)
GFR calc Af Amer: 84 mL/min/1.73
GFR calc non Af Amer: 73 mL/min/1.73
Globulin, Total: 3.4 g/dL (ref 1.5–4.5)
Glucose: 117 mg/dL — ABNORMAL HIGH (ref 65–99)
Potassium: 4.8 mmol/L (ref 3.5–5.2)
Sodium: 142 mmol/L (ref 134–144)
Total Protein: 7.6 g/dL (ref 6.0–8.5)

## 2018-06-12 LAB — LIPID PANEL
Chol/HDL Ratio: 2.8 ratio (ref 0.0–4.4)
Cholesterol, Total: 143 mg/dL (ref 100–199)
HDL: 51 mg/dL
LDL Calculated: 80 mg/dL (ref 0–99)
Triglycerides: 58 mg/dL (ref 0–149)
VLDL Cholesterol Cal: 12 mg/dL (ref 5–40)

## 2018-06-13 ENCOUNTER — Telehealth: Payer: Self-pay | Admitting: *Deleted

## 2018-06-13 NOTE — Telephone Encounter (Signed)
-----   Message from Cammie Fulp, MD sent at 06/13/2018  1:08 AM EST ----- Please notify patient of normal CMP with exception of glucose of 117.  Patient with lipid panel that is near normal.  LDL is 80 but as patient with diabetes, her LDL goal is 70 or less.  Patient should consider low-dose statin medication to help get LDL to 70 and also recommended in diabetic patients to decrease the risk of heart disease associated with diabetes 

## 2018-06-13 NOTE — Telephone Encounter (Signed)
duplicate

## 2018-06-13 NOTE — Telephone Encounter (Signed)
-----   Message from Cain Saupe, MD sent at 06/13/2018  1:08 AM EST ----- Please notify patient of normal CMP with exception of glucose of 117.  Patient with lipid panel that is near normal.  LDL is 80 but as patient with diabetes, her LDL goal is 70 or less.  Patient should consider low-dose statin medication to help get LDL to 70 and also recommended in diabetic patients to decrease the risk of heart disease associated with diabetes

## 2018-06-13 NOTE — Telephone Encounter (Signed)
Medical Assistant left message on patient's home and cell voicemail. Patient is aware of labs being normal and discussing a cholesterol medication at her next visit to support her heart with being diabetic.

## 2018-06-18 ENCOUNTER — Emergency Department (HOSPITAL_COMMUNITY)
Admission: EM | Admit: 2018-06-18 | Discharge: 2018-06-18 | Disposition: A | Discharge status: Home / Self Care | Payer: Self-pay | Attending: Emergency Medicine | Admitting: Emergency Medicine

## 2018-06-18 ENCOUNTER — Encounter (HOSPITAL_COMMUNITY): Payer: Self-pay

## 2018-06-18 ENCOUNTER — Other Ambulatory Visit: Payer: Self-pay

## 2018-06-18 DIAGNOSIS — I10 Essential (primary) hypertension: Secondary | ICD-10-CM | POA: Insufficient documentation

## 2018-06-18 DIAGNOSIS — X500XXA Overexertion from strenuous movement or load, initial encounter: Secondary | ICD-10-CM | POA: Insufficient documentation

## 2018-06-18 DIAGNOSIS — Z7984 Long term (current) use of oral hypoglycemic drugs: Secondary | ICD-10-CM | POA: Insufficient documentation

## 2018-06-18 DIAGNOSIS — E119 Type 2 diabetes mellitus without complications: Secondary | ICD-10-CM | POA: Insufficient documentation

## 2018-06-18 DIAGNOSIS — Z79899 Other long term (current) drug therapy: Secondary | ICD-10-CM | POA: Insufficient documentation

## 2018-06-18 DIAGNOSIS — M5440 Lumbago with sciatica, unspecified side: Secondary | ICD-10-CM | POA: Insufficient documentation

## 2018-06-18 MED ORDER — METHOCARBAMOL 500 MG PO TABS: 500.0000 mg | ORAL_TABLET | Freq: Two times a day (BID) | ORAL | 0 refills | Status: AC

## 2018-06-18 NOTE — ED Provider Notes (Signed)
[facility]-EMERGENCY DEPT Provider Note   CSN: [id number] Arrival date & time: 06/18/18  1348     History   Chief Complaint Chief Complaint  Patient presents with  . Back Pain    HPI [name] is a 58 y.o. female.  HPI   Pt is a 58 y/o female with a h/o diabetes, HTN, who presents to the ED today c/o low back pain that began about 4 days ago. States pain located to the right lower back and radiates down the RLE. 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 Initially pain was 10/10. Currently pain is not present. Pain is intermittent. Has been ambulatory.  Has tried tylenol and warm/cold compresses without much relief.  She used salon pas this morning which did help her symptoms.  No chest pain, SOB, or urinary sxs.  Pt denies any numbness/tingling/weakness to the BLE. Denies saddle anesthesia. Denies loss of control of bowels or bladder. No urinary retention. No fevers. Denies a h/o IVDU. Denies a h/o CA.  Past Medical History:  Diagnosis Date  . Diabetes mellitus without complication (HCC)   . Hypertension   . Leukocytosis 02/27/2017    Patient Active Problem List   Diagnosis Date Noted  . Leukocytosis 02/27/2017  . NONSPECIFIC ABN FINDING RAD & OTH EXAM GI TRACT 09/17/2008  . DM 09/16/2008  . OBESITY 09/16/2008  . HYPERTENSION 09/16/2008    Past Surgical History:  Procedure Laterality Date  . APPENDECTOMY       OB History   No obstetric history on file.      Home Medications    Prior to Admission medications   Medication Sig Start Date End Date Taking? Authorizing Provider  glipiZIDE (GLUCOTROL) 10 MG tablet Take 1 tablet (10 mg total) by mouth 2 (two) times daily before a meal. 06/11/18   Fulp, Cammie, MD  Insulin Glargine (BASAGLAR KWIKPEN) 100 UNIT/ML SOPN Inject 24 units into the skin daily 06/11/18   Fulp, Cammie, MD  lisinopril (PRINIVIL,ZESTRIL) 10 MG tablet Take 1 tablet (10 mg total)  by mouth daily. 06/11/18   Fulp, Cammie, MD  metFORMIN (GLUCOPHAGE) 1000 MG tablet Take 1 tablet (1,000 mg total) by mouth 2 (two) times daily with a meal. 06/11/18   Fulp, Cammie, MD  methocarbamol (ROBAXIN) 500 MG tablet Take 1 tablet (500 mg total) by mouth 2 (two) times daily for 3 days. 06/18/18 06/21/18  Nate Common S, PA-C    Family History Family History  Problem Relation Age of Onset  . Anemia Mother   . Diabetes Father   . Alzheimer's disease Father   . Diabetes Brother   . Hypertension Daughter   . Diabetes Daughter     Social History Social History   Tobacco Use  . Smoking status: Never Smoker  . Smokeless tobacco: Never Used  Substance Use Topics  . Alcohol use: No  . Drug use: No     Allergies   Latex   Review of Systems Review of Systems  Constitutional: Negative for fever.  Eyes: Negative for photophobia.  Respiratory: Negative for shortness of breath.   Cardiovascular: Negative for chest pain.  Gastrointestinal: Negative for abdominal pain, blood in stool, constipation, diarrhea, nausea and vomiting.  Genitourinary: Negative for dysuria, flank pain, frequency, hematuria, pelvic pain and urgency.  Musculoskeletal: Positive for back pain. Negative for gait problem.  Skin: Negative for wound.  Neurological: Negative for weakness and numbness.  Physical Exam Updated Vital Signs BP (!) 154/66 (BP Location: Left Arm)   Pulse 86   Temp 99 F (37.2 C) (Oral)   Resp 16   Ht 5\' 1"  (1.549 m)   Wt 79.4 kg   SpO2 98%   BMI 33.07 kg/m   Physical Exam Vitals signs and nursing note reviewed.  Constitutional:      General: She is not in acute distress.    Appearance: She is well-developed.  HENT:     Head: Normocephalic and atraumatic.  Eyes:     Conjunctiva/sclera: Conjunctivae normal.  Neck:     Musculoskeletal: Neck supple.  Cardiovascular:     Rate and Rhythm: Normal rate and regular rhythm.     Heart sounds: Normal heart sounds. No murmur.   Pulmonary:     Effort: Pulmonary effort is normal. No respiratory distress.     Breath sounds: Normal breath sounds. No rhonchi.  Abdominal:     Palpations: Abdomen is soft.     Tenderness: There is no abdominal tenderness. There is no guarding or rebound.  Musculoskeletal:     Comments: No midline lumbar tenderness.  No tenderness to the bilateral lumbar paraspinous muscles.  Patient does have tenderness over the right upper buttock and along the right sciatic notch that reproduces her symptoms.  Skin:    General: Skin is warm and dry.  Neurological:     Mental Status: She is alert.     Comments: 5/5 strength in the bilateral lower extremities.  Normal sensation throughout.  Steady gait without limp.  Psychiatric:        Mood and Affect: Mood normal.      ED Treatments / Results  Labs (all labs ordered are listed, but only abnormal results are displayed) Labs Reviewed - No data to display  EKG None  Radiology No results found.  Procedures Procedures (including critical care time)  Medications Ordered in ED Medications - No data to display   Initial Impression / Assessment and Plan / ED Course  I have reviewed the triage vital signs and the nursing notes.  Pertinent labs & imaging results that were available during my care of the patient were reviewed by me and considered in my medical decision making (see chart for details).     Final Clinical Impressions(s) / ED Diagnoses   Final diagnoses:  Acute right-sided low back pain with sciatica, sciatica laterality unspecified   Normal neurological exam, no evidence of urinary incontinence or retention, pain is consistently reproducible. There is no evidence of AAA or concern for dissection at this time.   Patient can walk but states is painful.  No loss of bowel or bladder control.  No concern for cauda equina.  No fever, night sweats, weight loss, h/o cancer, IVDU.  Pain treated here in the department with adequate  improvement. RICE protocol and pain medicine indicated and discussed with patient. I have also discussed reasons to return immediately to the ER.  Patient expresses understanding and agrees with plan.   ED Discharge Orders         Ordered    methocarbamol (ROBAXIN) 500 MG tablet  2 times daily     06/18/18 [address] Jeramia Saleeby S, PA-C 06/18/18 1523    Pricilla Loveless, MD 06/19/18 [phone]

## 2018-06-18 NOTE — ED Triage Notes (Signed)
patient c/o right lower back pain x 3 days. 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 patient states the pain radiates down the left leg.

## 2018-06-18 NOTE — ED Notes (Addendum)
Patient ambulatory to restroom without difficulty or assistance.

## 2018-06-18 NOTE — Discharge Instructions (Addendum)

## 2018-06-25 ENCOUNTER — Ambulatory Visit: Payer: Self-pay

## 2018-07-06 ENCOUNTER — Ambulatory Visit: Payer: Self-pay | Attending: Family Medicine

## 2018-10-11 ENCOUNTER — Ambulatory Visit: Payer: Self-pay | Admitting: Family Medicine

## 2018-10-11 ENCOUNTER — Other Ambulatory Visit: Payer: Self-pay

## 2019-07-20 ENCOUNTER — Ambulatory Visit: Payer: Self-pay | Attending: Internal Medicine

## 2019-07-20 DIAGNOSIS — Z23 Encounter for immunization: Secondary | ICD-10-CM | POA: Insufficient documentation

## 2019-07-20 NOTE — Progress Notes (Signed)
   Covid-19 Vaccination Clinic  Name:  [name]    MRN: [id number] DOB: [birth year]  07/20/2019  [name] was observed post Covid-19 immunization for 15 minutes without incident. She was provided with Vaccine Information Sheet and instruction to access the V-Safe system.   [name] was instructed to call 911 with any severe reactions post vaccine: Marland Kitchen Difficulty breathing  . Swelling of face and throat  . A fast heartbeat  . A bad rash all over body  . Dizziness and weakness   Immunizations Administered    Name Date Dose VIS Date Route   Pfizer COVID-19 Vaccine 07/20/2019  9:11 AM 0.3 mL 04/26/2019 Intramuscular   Manufacturer: [employer], [employer]   Lot: [id number]   NDC: [address]-1

## 2019-08-10 ENCOUNTER — Ambulatory Visit: Payer: Self-pay | Attending: Internal Medicine

## 2019-08-10 DIAGNOSIS — Z23 Encounter for immunization: Secondary | ICD-10-CM

## 2019-08-10 NOTE — Progress Notes (Signed)
   Covid-19 Vaccination Clinic  Name:  Cali Hope    MRN: [id number] DOB: [birth year]  08/10/2019  Ms. Bo was observed post Covid-19 immunization for 15 minutes without incident. She was provided with Vaccine Information Sheet and instruction to access the V-Safe system.   Ms. Bordeau was instructed to call 911 with any severe reactions post vaccine: Marland Kitchen Difficulty breathing  . Swelling of face and throat  . A fast heartbeat  . A bad rash all over body  . Dizziness and weakness   Immunizations Administered    Name Date Dose VIS Date Route   Pfizer COVID-19 Vaccine 08/10/2019  9:07 AM 0.3 mL 04/26/2019 Intramuscular   Manufacturer: [employer], [employer]   Lot: [id number]   NDC: [address]-3      Covid-19 Vaccination Clinic  Name:  Dusty Wagoner    MRN: [id number] DOB: 1960-09-08  08/10/2019  Ms. Newland was observed post Covid-19 immunization for 15 minutes without incident. She was provided with Vaccine Information Sheet and instruction to access the V-Safe system.   Ms. Mello was instructed to call 911 with any severe reactions post vaccine: Marland Kitchen Difficulty breathing  . Swelling of face and throat  . A fast heartbeat  . A bad rash all over body  . Dizziness and weakness   Immunizations Administered    Name Date Dose VIS Date Route   Pfizer COVID-19 Vaccine 08/10/2019  9:07 AM 0.3 mL 04/26/2019 Intramuscular   Manufacturer: [employer], [employer]   Lot: [id number]   NDC: [address]-1

## 2022-04-21 ENCOUNTER — Emergency Department (HOSPITAL_BASED_OUTPATIENT_CLINIC_OR_DEPARTMENT_OTHER)
Admission: EM | Admit: 2022-04-21 | Discharge: 2022-04-21 | Disposition: A | Discharge status: Home / Self Care | Payer: Commercial Managed Care - HMO | Attending: Emergency Medicine | Admitting: Emergency Medicine

## 2022-04-21 ENCOUNTER — Other Ambulatory Visit: Payer: Self-pay

## 2022-04-21 ENCOUNTER — Encounter (HOSPITAL_BASED_OUTPATIENT_CLINIC_OR_DEPARTMENT_OTHER): Payer: Self-pay | Admitting: Urology

## 2022-04-21 DIAGNOSIS — Z1152 Encounter for screening for COVID-19: Secondary | ICD-10-CM | POA: Diagnosis not present

## 2022-04-21 DIAGNOSIS — Z91040 Latex allergy status: Secondary | ICD-10-CM | POA: Insufficient documentation

## 2022-04-21 DIAGNOSIS — Z7984 Long term (current) use of oral hypoglycemic drugs: Secondary | ICD-10-CM | POA: Diagnosis not present

## 2022-04-21 DIAGNOSIS — E119 Type 2 diabetes mellitus without complications: Secondary | ICD-10-CM | POA: Diagnosis not present

## 2022-04-21 DIAGNOSIS — H6123 Impacted cerumen, bilateral: Secondary | ICD-10-CM | POA: Insufficient documentation

## 2022-04-21 DIAGNOSIS — I10 Essential (primary) hypertension: Secondary | ICD-10-CM | POA: Insufficient documentation

## 2022-04-21 DIAGNOSIS — Z794 Long term (current) use of insulin: Secondary | ICD-10-CM | POA: Diagnosis not present

## 2022-04-21 DIAGNOSIS — B349 Viral infection, unspecified: Secondary | ICD-10-CM | POA: Diagnosis not present

## 2022-04-21 DIAGNOSIS — H9203 Otalgia, bilateral: Secondary | ICD-10-CM | POA: Diagnosis present

## 2022-04-21 LAB — RESP PANEL BY RT-PCR (FLU A&B, COVID) ARPGX2
Influenza A by PCR: NEGATIVE
Influenza B by PCR: NEGATIVE
SARS Coronavirus 2 by RT PCR: NEGATIVE

## 2022-04-21 MED ORDER — CARBAMIDE PEROXIDE 6.5 % OT SOLN: 5.0000 [drp] | Freq: Two times a day (BID) | OTIC | 0 refills | Status: DC

## 2022-04-21 NOTE — Discharge Instructions (Signed)
You were seen in the emergency department for your ear pain and hoarse voice.  You tested negative for COVID and flu but likely have another viral infection causing your symptoms.  You also had significant earwax buildup bilaterally.  You should use earwax drops daily and rinse your ears in the shower to help loosen up and clear out your earwax and you can have your ears and symptoms rechecked by your primary doctor in the next few days.  You should return to the emergency department for fevers, shortness of breath or if you have any other new or concerning symptoms.

## 2022-04-21 NOTE — ED Triage Notes (Signed)
Pt states voice is hoarse and bilateral ear pain since yesterday, states nasal drainage, denies sore throat  Denies fever

## 2022-04-21 NOTE — ED Provider Notes (Signed)
[facility] EMERGENCY DEPARTMENT Provider Note   CSN: [id number] Arrival date & time: 04/21/22  1744     History  Chief Complaint  Patient presents with   Otalgia    [name] is a 61 y.o. female.  Patient is a 61 year old female with a past medical history of diabetes and hypertension presenting to the emergency department with bilateral ear pain and hoarseness.  She states that her symptoms have been going on for the last 2 days.  She states that she has mildly increased pain in the left side compared to the right ear.  She denies any sore throat and just feels like her throat is hoarse.  She states that she does have some postnasal drip.  She denies any fevers, cough or shortness of breath.  She denies any nausea, vomiting or diarrhea.  She denies any known sick contacts.  The history is provided by the patient and a relative.  Otalgia      Home Medications Prior to Admission medications   Medication Sig Start Date End Date Taking? Authorizing Provider  carbamide peroxide (DEBROX) 6.5 % OTIC solution Place 5 drops into both ears 2 (two) times daily. 04/21/22  Yes Theresia Lo, Laymond Postle K, DO  glipiZIDE (GLUCOTROL) 10 MG tablet Take 1 tablet (10 mg total) by mouth 2 (two) times daily before a meal. 06/11/18   Fulp, Cammie, MD  Insulin Glargine (BASAGLAR KWIKPEN) 100 UNIT/ML SOPN Inject 24 units into the skin daily 06/11/18   Fulp, Cammie, MD  lisinopril (PRINIVIL,ZESTRIL) 10 MG tablet Take 1 tablet (10 mg total) by mouth daily. 06/11/18   Fulp, Cammie, MD  metFORMIN (GLUCOPHAGE) 1000 MG tablet Take 1 tablet (1,000 mg total) by mouth 2 (two) times daily with a meal. 06/11/18   Fulp, Cammie, MD      Allergies    Latex    Review of Systems   Review of Systems  HENT:  Positive for ear pain.     Physical Exam Updated Vital Signs BP (!) 191/88 (BP Location: Right Arm)   Pulse (!) 101   Temp 98.2 F (36.8 C) (Oral)   Resp 18   Ht 5\' 1"  (1.549 m)   Wt 79.4 kg    SpO2 94%   BMI 33.07 kg/m  Physical Exam Vitals and nursing note reviewed.  Constitutional:      General: She is not in acute distress.    Appearance: Normal appearance.  HENT:     Head: Normocephalic and atraumatic.     Right Ear: Ear canal and external ear normal. There is impacted cerumen.     Left Ear: Tympanic membrane, ear canal and external ear normal. There is impacted cerumen.     Nose: Nose normal.     Mouth/Throat:     Mouth: Mucous membranes are moist.     Pharynx: Oropharynx is clear.  Eyes:     Extraocular Movements: Extraocular movements intact.     Conjunctiva/sclera: Conjunctivae normal.  Cardiovascular:     Rate and Rhythm: Normal rate and regular rhythm.     Pulses: Normal pulses.     Heart sounds: Normal heart sounds.  Pulmonary:     Effort: Pulmonary effort is normal.     Breath sounds: Normal breath sounds.  Abdominal:     General: Abdomen is flat.     Palpations: Abdomen is soft.     Tenderness: There is no abdominal tenderness.  Musculoskeletal:        General: Normal range of  motion.     Cervical back: Normal range of motion and neck supple.     Right lower leg: No edema.     Left lower leg: No edema.  Skin:    General: Skin is warm and dry.  Neurological:     General: No focal deficit present.     Mental Status: She is alert and oriented to person, place, and time.  Psychiatric:        Mood and Affect: Mood normal.        Behavior: Behavior normal.     ED Results / Procedures / Treatments   Labs (all labs ordered are listed, but only abnormal results are displayed) Labs Reviewed  RESP PANEL BY RT-PCR (FLU A&B, COVID) [id number]    EKG None  Radiology No results found.  Procedures Procedures    Medications Ordered in ED Medications - No data to display  ED Course/ Medical Decision Making/ A&P                           Medical Decision Making This patient presents to the ED with chief complaint(s) of ear pain and hoarseness  with pertinent past medical history of diabetes, hypertension which further complicates the presenting complaint. The complaint involves an extensive differential diagnosis and also carries with it a high risk of complications and morbidity.    The differential diagnosis includes viral syndrome, cerumen impaction, otitis media, no evidence of otitis externa, no tonsillar swelling or exudates making strep throat unlikely, no evidence of Ludwig's, PTA or RPA on exam, no cough, fever or shortness of breath making pneumonia unlikely  Additional history obtained: Additional history obtained from family Records reviewed N/A  ED Course and Reassessment: Upon patient's arrival to the emergency department she is awake alert well-appearing in no acute distress.  She will have a viral swab performed.  The patient did have bilateral cerumen impactions.  I was able to remove the cerumen of the left ear and visualize her TM which was normal in appearance.  It was unable to remove enough cerumen on the right to adequately view her TM and using shared decision-making with the patient, recommended earwax drops and follow-up outpatient for ear recheck.  Viral swabs were negative for COVID and flu.  She is stable for discharge home with primary care follow-up and was given strict return precautions.  Independent labs interpretation:  The following labs were independently interpreted: Negative viral panel  Independent visualization of imaging: N/A  Consultation: - Consulted or discussed management/test interpretation w/ external professional: N/A  Consideration for admission or further workup: Patient has no emergent conditions requiring admission or further work-up at this time and is stable for discharge home with primary care follow-up  Social Determinants of health: N/A            Final Clinical Impression(s) / ED Diagnoses Final diagnoses:  Viral syndrome  Bilateral impacted cerumen    Rx /  DC Orders ED Discharge Orders          Ordered    carbamide peroxide (DEBROX) 6.5 % OTIC solution  2 times daily        04/21/22 2051              Rexford Maus, DO 04/21/22 2053

## 2022-04-22 ENCOUNTER — Encounter: Payer: Commercial Managed Care - HMO | Admitting: Radiology

## 2022-04-22 ENCOUNTER — Ambulatory Visit: Payer: Commercial Managed Care - HMO | Admitting: Family

## 2022-04-28 ENCOUNTER — Encounter: Payer: Self-pay | Admitting: *Deleted

## 2022-05-13 ENCOUNTER — Ambulatory Visit: Payer: Commercial Managed Care - HMO | Admitting: Physician Assistant

## 2022-05-20 ENCOUNTER — Encounter: Payer: Self-pay | Admitting: Physician Assistant

## 2022-05-20 ENCOUNTER — Ambulatory Visit (INDEPENDENT_AMBULATORY_CARE_PROVIDER_SITE_OTHER): Payer: Commercial Managed Care - HMO | Admitting: Physician Assistant

## 2022-05-20 ENCOUNTER — Ambulatory Visit: Payer: Self-pay | Admitting: Physician Assistant

## 2022-05-20 VITALS — BP 160/80 | HR 87 | Temp 97.8°F | Ht 61.0 in | Wt 167.6 lb

## 2022-05-20 DIAGNOSIS — E1165 Type 2 diabetes mellitus with hyperglycemia: Secondary | ICD-10-CM

## 2022-05-20 DIAGNOSIS — Z114 Encounter for screening for human immunodeficiency virus [HIV]: Secondary | ICD-10-CM

## 2022-05-20 DIAGNOSIS — I10 Essential (primary) hypertension: Secondary | ICD-10-CM

## 2022-05-20 DIAGNOSIS — Z1211 Encounter for screening for malignant neoplasm of colon: Secondary | ICD-10-CM

## 2022-05-20 DIAGNOSIS — D72829 Elevated white blood cell count, unspecified: Secondary | ICD-10-CM

## 2022-05-20 DIAGNOSIS — Z1159 Encounter for screening for other viral diseases: Secondary | ICD-10-CM

## 2022-05-20 DIAGNOSIS — E119 Type 2 diabetes mellitus without complications: Secondary | ICD-10-CM

## 2022-05-20 DIAGNOSIS — Z794 Long term (current) use of insulin: Secondary | ICD-10-CM

## 2022-05-20 LAB — LIPID PANEL
Cholesterol: 181 mg/dL (ref 0–200)
HDL: 37.3 mg/dL — ABNORMAL LOW (ref >39.00)
LDL Cholesterol: 127 mg/dL — ABNORMAL HIGH (ref 0–99)
NonHDL: 144.11
Total CHOL/HDL Ratio: 5
Triglycerides: 87 mg/dL (ref 0.0–149.0)
VLDL: 17.4 mg/dL (ref 0.0–40.0)

## 2022-05-20 LAB — COMPREHENSIVE METABOLIC PANEL
ALT: 23 U/L (ref 0–35)
AST: 20 U/L (ref 0–37)
Albumin: 4 g/dL (ref 3.5–5.2)
Alkaline Phosphatase: 88 U/L (ref 39–117)
BUN: 17 mg/dL (ref 6–23)
CO2: 28 mEq/L (ref 19–32)
Calcium: 10 mg/dL (ref 8.4–10.5)
Chloride: 100 mEq/L (ref 96–112)
Creatinine, Ser: 0.77 mg/dL (ref 0.40–1.20)
GFR: 82.21 mL/min (ref >60.00)
Glucose, Bld: 242 mg/dL — ABNORMAL HIGH (ref 70–99)
Potassium: 4.2 mEq/L (ref 3.5–5.1)
Sodium: 138 mEq/L (ref 135–145)
Total Bilirubin: 0.7 mg/dL (ref 0.2–1.2)
Total Protein: 7.7 g/dL (ref 6.0–8.3)

## 2022-05-20 LAB — CBC WITH DIFFERENTIAL/PLATELET
Basophils Absolute: 0.1 10*3/uL (ref 0.0–0.1)
Basophils Relative: 0.6 % (ref 0.0–3.0)
Eosinophils Absolute: 0.1 10*3/uL (ref 0.0–0.7)
Eosinophils Relative: 1.1 % (ref 0.0–5.0)
HCT: 41.9 % (ref 36.0–46.0)
Hemoglobin: 13.7 g/dL (ref 12.0–15.0)
Lymphocytes Relative: 18.8 % (ref 12.0–46.0)
Lymphs Abs: 2.5 10*3/uL (ref 0.7–4.0)
MCHC: 32.6 g/dL (ref 30.0–36.0)
MCV: 86.2 fl (ref 78.0–100.0)
Monocytes Absolute: 0.7 10*3/uL (ref 0.1–1.0)
Monocytes Relative: 5.2 % (ref 3.0–12.0)
Neutro Abs: 9.7 10*3/uL — ABNORMAL HIGH (ref 1.4–7.7)
Neutrophils Relative %: 74.3 % (ref 43.0–77.0)
Platelets: 280 10*3/uL (ref 150.0–400.0)
RBC: 4.87 Mil/uL (ref 3.87–5.11)
RDW: 12.9 % (ref 11.5–15.5)
WBC: 13.1 10*3/uL — ABNORMAL HIGH (ref 4.0–10.5)

## 2022-05-20 LAB — TSH: TSH: 0.42 u[IU]/mL (ref 0.35–5.50)

## 2022-05-20 LAB — POCT GLYCOSYLATED HEMOGLOBIN (HGB A1C): Hemoglobin A1C: 11.6 % — AB (ref 4.0–5.6)

## 2022-05-20 LAB — MICROALBUMIN / CREATININE URINE RATIO
Creatinine,U: 125.2 mg/dL
Microalb Creat Ratio: 4.9 mg/g (ref 0.0–30.0)
Microalb, Ur: 6.1 mg/dL — ABNORMAL HIGH (ref 0.0–1.9)

## 2022-05-20 MED ORDER — LISINOPRIL 10 MG PO TABS: 10.0000 mg | ORAL_TABLET | Freq: Every day | ORAL | 2 refills | Status: DC

## 2022-05-20 MED ORDER — GLIPIZIDE 10 MG PO TABS: 10.0000 mg | ORAL_TABLET | Freq: Two times a day (BID) | ORAL | 2 refills | Status: DC

## 2022-05-20 MED ORDER — BLOOD GLUCOSE MONITOR KIT: PACK | 0 refills | Status: AC

## 2022-05-20 NOTE — Progress Notes (Unsigned)
Subjective:    Patient ID: [name], female    DOB: Dec 28, 1960, 62 y.o.   MRN: [id number]  Chief Complaint  Patient presents with   New Patient (Initial Visit)    HPI 62 y.o. patient presents today for new patient establishment with me.  Patient was previously established with Dr. Jillyn Hidden. Here with daughter, [name]. She has 2 daughters and 6 grandchildren.   Current Care Team: No current specialists   Acute Concerns: She had a lapse in insurance and has been without medications, regular medical care for quite some time.   Chronic Concerns: See PMH listed below, as well as A/P for details on issues we specifically discussed during today's visit.      Past Medical History:  Diagnosis Date   Diabetes mellitus without complication (HCC)    Hypertension    Leukocytosis 02/27/2017    Past Surgical History:  Procedure Laterality Date   APPENDECTOMY     TUBAL LIGATION      Family History  Problem Relation Age of Onset   Anemia Mother    Diabetes Father    Alzheimer's disease Father    Diabetes Brother    Hypertension Daughter    Diabetes Daughter    Miscarriages / [place] Daughter    Obesity Daughter    Miscarriages / [place] Daughter     Social History   Tobacco Use   Smoking status: Never   Smokeless tobacco: Never  Vaping Use   Vaping Use: Never used  Substance Use Topics   Alcohol use: No   Drug use: No     Allergies  Allergen Reactions   Latex Itching    Itching, redness   Metformin And Related Other (See Comments)    Blurred vision     Review of Systems NEGATIVE UNLESS OTHERWISE INDICATED IN HPI      Objective:     BP (!) 160/80 (BP Location: Right Arm)   Pulse 87   Temp 97.8 F (36.6 C) (Temporal)   Ht 5\' 1"  (1.549 m)   Wt 167 lb 9.6 oz (76 kg)   SpO2 100%   BMI 31.67 kg/m   Wt Readings from Last 3 Encounters:  05/20/22 167 lb 9.6 oz (76 kg)  04/21/22 175 lb 0.7 oz (79.4 kg)  06/18/18 175 lb (79.4 kg)    BP  Readings from Last 3 Encounters:  05/20/22 (!) 160/80  04/21/22 (!) 150/77  06/18/18 (!) 154/66     Physical Exam Vitals and nursing note reviewed.  Constitutional:      Appearance: Normal appearance. She is normal weight. She is not toxic-appearing.  HENT:     Head: Normocephalic and atraumatic.     Right Ear: Tympanic membrane, ear canal and external ear normal.     Left Ear: Tympanic membrane, ear canal and external ear normal.     Nose: Nose normal.     Mouth/Throat:     Mouth: Mucous membranes are moist.  Eyes:     Extraocular Movements: Extraocular movements intact.     Conjunctiva/sclera: Conjunctivae normal.     Pupils: Pupils are equal, round, and reactive to light.  Cardiovascular:     Rate and Rhythm: Normal rate and regular rhythm.     Pulses: Normal pulses.          Dorsalis pedis pulses are 2+ on the right side and 2+ on the left side.       Posterior tibial pulses are 2+ on the right side and  2+ on the left side.     Heart sounds: Normal heart sounds.  Pulmonary:     Effort: Pulmonary effort is normal.     Breath sounds: Normal breath sounds.  Abdominal:     General: Abdomen is flat. Bowel sounds are normal.     Palpations: Abdomen is soft.  Musculoskeletal:        General: Normal range of motion.     Cervical back: Normal range of motion and neck supple.     Right lower leg: No edema.     Left lower leg: No edema.     Right foot: Normal range of motion. No deformity.     Left foot: Normal range of motion. No deformity.  Feet:     Right foot:     Protective Sensation: 10 sites tested.  10 sites sensed.     Skin integrity: Skin integrity normal.     Toenail Condition: Right toenails are abnormally thick.     Left foot:     Protective Sensation: 10 sites tested.  10 sites sensed.     Skin integrity: Skin integrity normal.     Toenail Condition: Left toenails are abnormally thick.  Skin:    General: Skin is warm and dry.  Neurological:     General: No  focal deficit present.     Mental Status: She is alert and oriented to person, place, and time.  Psychiatric:        Mood and Affect: Mood normal.        Behavior: Behavior normal.        Thought Content: Thought content normal.        Judgment: Judgment normal.        Assessment & Plan:  Type 2 diabetes mellitus with hyperglycemia, without long-term current use of insulin (HCC) Assessment & Plan: Lab Results  Component Value Date   HGBA1C 11.6 (A) 05/20/2022   Very uncontrolled, asymptomatic surprisingly per patient  Update labs today Start back on glipizide 10 mg BID Log glucose readings at home and bring to appt Anticipate starting back on insulin also  Refer to ophthalmology   Orders: -     POCT glycosylated hemoglobin (Hb A1C) -     blood glucose meter kit and supplies; Dispense based on patient and insurance preference. Use up to four times daily as directed.  Dispense: 1 each; Refill: 0 -     CBC with Differential/Platelet -     Comprehensive metabolic panel -     Lipid panel -     TSH -     Microalbumin / creatinine urine ratio -     Ambulatory referral to Ophthalmology  Essential hypertension Assessment & Plan: Uncontrolled Start back on lisinopril 10 mg  Monitor at home, bring log to next appt   Orders: -     Lisinopril; Take 1 tablet (10 mg total) by mouth daily.  Dispense: 30 tablet; Refill: 2 -     TSH  Leukocytosis, unspecified type Assessment & Plan: Repeat CBC, smear review today Previously followed with hematology but full workup unable to be completed due to lapse in insurance   Orders: -     CBC with Differential/Platelet -     Pathologist smear review  Type 2 diabetes mellitus without complication, with long-term current use of insulin (HCC) -     glipiZIDE; Take 1 tablet (10 mg total) by mouth 2 (two) times daily before a meal.  Dispense: 60 tablet; Refill: 2  Encounter for screening for HIV -     HIV Antibody (routine testing w  rflx)  Need for hepatitis C screening test -     Hepatitis C antibody  Screening for colon cancer -     Ambulatory referral to Gastroenterology        Return in about 4 weeks (around 06/17/2022) for med / BP recheck .  This note was prepared with assistance of Systems analyst. Occasional wrong-word or sound-a-like substitutions may have occurred due to the inherent limitations of voice recognition software.     Dezyre Hoefer M Merritt Mccravy, PA-C

## 2022-05-20 NOTE — Patient Instructions (Addendum)
Welcome to [facility]! It was a pleasure meeting you today.  As discussed, Please schedule a 1 month follow up visit today.  Labs & urine today  Start back on Glipizide and Lisinopril as directed Monitor glucose readings & Blood pressure at home every few days and keep log to bring to appt.  Referral to eye specialist and GI (for colon cancer screening).  PLEASE NOTE:  If you had any LAB tests please let us know if you have not heard back within a few days. You may see your results on MyChart before we have a chance to review them but we will give you a call once they are reviewed by [place]. If we ordered any REFERRALS today, please let us know if you have not heard from their office within the next two weeks. Let us know through MyChart if you are needing REFILLS, or have your pharmacy send [place] the request. You can also use MyChart to communicate with me or any office staff.  Please try these tips to maintain a healthy lifestyle:  Eat most of your calories during the day when you are active. Eliminate processed foods including packaged sweets (pies, cakes, cookies), reduce intake of potatoes, white bread, white pasta, and white rice. Look for whole grain options, oat flour or almond flour.  Each meal should contain half fruits/vegetables, one quarter protein, and one quarter carbs (no bigger than a computer mouse).  Cut down on sweet beverages. This includes juice, soda, and sweet tea. Also watch fruit intake, though this is a healthier sweet option, it still contains natural sugar! Limit to 3 servings daily.  Drink at least 1 glass of water with each meal and aim for at least 8 glasses (64 ounces) per day.  Exercise at least 150 minutes every week to the best of your ability.    Take Care,  Kevonna Nolte, PA-C

## 2022-05-22 NOTE — Assessment & Plan Note (Signed)
Repeat CBC, smear review today Previously followed with hematology but full workup unable to be completed due to lapse in insurance

## 2022-05-22 NOTE — Assessment & Plan Note (Signed)
Uncontrolled Start back on lisinopril 10 mg  Monitor at home, bring log to next appt

## 2022-05-22 NOTE — Assessment & Plan Note (Addendum)
Lab Results  Component Value Date   HGBA1C 11.6 (A) 05/20/2022   Very uncontrolled, asymptomatic surprisingly per patient  Update labs today Start back on glipizide 10 mg BID Log glucose readings at home and bring to appt Anticipate starting back on insulin also  Refer to ophthalmology

## 2022-05-23 LAB — HEPATITIS C ANTIBODY: Hepatitis C Ab: NONREACTIVE

## 2022-05-23 LAB — HIV ANTIBODY (ROUTINE TESTING W REFLEX): HIV 1&2 Ab, 4th Generation: NONREACTIVE

## 2022-05-23 LAB — PATHOLOGIST SMEAR REVIEW

## 2022-06-20 ENCOUNTER — Ambulatory Visit: Payer: No Typology Code available for payment source | Admitting: Physician Assistant

## 2022-12-29 ENCOUNTER — Encounter (INDEPENDENT_AMBULATORY_CARE_PROVIDER_SITE_OTHER): Payer: Self-pay

## 2022-12-29 ENCOUNTER — Other Ambulatory Visit: Payer: Self-pay | Admitting: Physician Assistant

## 2022-12-29 DIAGNOSIS — Z1231 Encounter for screening mammogram for malignant neoplasm of breast: Secondary | ICD-10-CM

## 2023-01-20 ENCOUNTER — Encounter: Payer: Self-pay | Admitting: Pharmacist

## 2023-01-24 ENCOUNTER — Ambulatory Visit: Payer: Medicaid Other | Admitting: Physician Assistant

## 2023-01-24 DIAGNOSIS — Z1231 Encounter for screening mammogram for malignant neoplasm of breast: Secondary | ICD-10-CM

## 2023-01-27 ENCOUNTER — Encounter: Payer: Self-pay | Admitting: Pharmacist

## 2023-02-01 ENCOUNTER — Ambulatory Visit: Payer: BLUE CROSS/BLUE SHIELD | Admitting: Physician Assistant

## 2023-02-01 ENCOUNTER — Encounter: Payer: Self-pay | Admitting: Physician Assistant

## 2023-02-01 VITALS — BP 138/84 | HR 96 | Temp 97.7°F | Ht 61.0 in | Wt 177.8 lb

## 2023-02-01 DIAGNOSIS — E1165 Type 2 diabetes mellitus with hyperglycemia: Secondary | ICD-10-CM

## 2023-02-01 DIAGNOSIS — I10 Essential (primary) hypertension: Secondary | ICD-10-CM | POA: Diagnosis not present

## 2023-02-01 DIAGNOSIS — Z794 Long term (current) use of insulin: Secondary | ICD-10-CM

## 2023-02-01 DIAGNOSIS — Z7984 Long term (current) use of oral hypoglycemic drugs: Secondary | ICD-10-CM | POA: Diagnosis not present

## 2023-02-01 DIAGNOSIS — Z23 Encounter for immunization: Secondary | ICD-10-CM

## 2023-02-01 DIAGNOSIS — Z1211 Encounter for screening for malignant neoplasm of colon: Secondary | ICD-10-CM

## 2023-02-01 LAB — POCT GLYCOSYLATED HEMOGLOBIN (HGB A1C): Hemoglobin A1C: 12.4 % — AB (ref 4.0–5.6)

## 2023-02-01 MED ORDER — FREESTYLE LIBRE 14 DAY READER DEVI
1.0000 | 11 refills | Status: AC
Start: 2023-02-01 — End: ?

## 2023-02-01 MED ORDER — TRESIBA FLEXTOUCH 100 UNIT/ML ~~LOC~~ SOPN: PEN_INJECTOR | SUBCUTANEOUS | 2 refills | Status: AC

## 2023-02-01 MED ORDER — FREESTYLE LIBRE 14 DAY SENSOR MISC
11 refills | Status: AC
Start: 2023-02-01 — End: ?

## 2023-02-01 MED ORDER — LISINOPRIL 10 MG PO TABS
10.0000 mg | ORAL_TABLET | Freq: Every day | ORAL | 2 refills | Status: AC
Start: 2023-02-01 — End: ?

## 2023-02-01 MED ORDER — GLIPIZIDE 10 MG PO TABS: 10.0000 mg | ORAL_TABLET | Freq: Two times a day (BID) | ORAL | 2 refills | Status: AC

## 2023-02-01 NOTE — Assessment & Plan Note (Signed)
Lab Results  Component Value Date   HGBA1C 12.4 (A) 02/01/2023   Very uncontrolled Start back on insulin as directed glipizide 10 mg BID Log glucose readings at home and bring to appt Freestyle Rx today Close f/up with me Cut out sweet tea encouraged

## 2023-02-01 NOTE — Assessment & Plan Note (Signed)
Normotensive, stable, chronic Lisinopril 10 mg every day

## 2023-02-01 NOTE — Progress Notes (Signed)
Subjective:    Patient ID: Kassandre Wattley, female    DOB: 08/24/1960, 62 y.o.   MRN: [id number]  Chief Complaint  Patient presents with   Follow-up    Pt here for medication f/u on diabetic med and Hypertension med   Diabetes    Diabetes   Patient is in today for f/up. States she had another lapse in insurance since last visit. Works for daycare. Feels well today, no concerns per patient.  Mammogram & pap smear on 02/08/23.  Past Medical History:  Diagnosis Date   Diabetes mellitus without complication (HCC)    Hypertension    Leukocytosis 02/27/2017    Past Surgical History:  Procedure Laterality Date   APPENDECTOMY     TUBAL LIGATION      Family History  Problem Relation Age of Onset   Anemia Mother    Diabetes Father    Alzheimer's disease Father    Diabetes Brother    Hypertension Daughter    Diabetes Daughter    Miscarriages / [place] Daughter    Obesity Daughter    Miscarriages / [place] Daughter     Social History   Tobacco Use   Smoking status: Never   Smokeless tobacco: Never  Vaping Use   Vaping status: Never Used  Substance Use Topics   Alcohol use: No   Drug use: No     Allergies  Allergen Reactions   Latex Itching    Itching, redness   Metformin And Related Other (See Comments)    Blurred vision     Review of Systems NEGATIVE UNLESS OTHERWISE INDICATED IN HPI      Objective:     BP 138/84   Pulse 96   Temp 97.7 F (36.5 C)   Ht 5\' 1"  (1.549 m)   Wt 177 lb 12.8 oz (80.6 kg)   SpO2 100%   BMI 33.60 kg/m   Wt Readings from Last 3 Encounters:  02/01/23 177 lb 12.8 oz (80.6 kg)  05/20/22 167 lb 9.6 oz (76 kg)  04/21/22 175 lb 0.7 oz (79.4 kg)    BP Readings from Last 3 Encounters:  02/01/23 138/84  05/20/22 (!) 160/80  04/21/22 (!) 150/77     Physical Exam Vitals and nursing note reviewed.  Constitutional:      Appearance: Normal appearance.  Cardiovascular:     Rate and Rhythm: Normal rate and  regular rhythm.     Pulses: Normal pulses.     Heart sounds: Normal heart sounds. No murmur heard. Pulmonary:     Effort: Pulmonary effort is normal.     Breath sounds: Normal breath sounds.  Neurological:     General: No focal deficit present.     Mental Status: She is alert and oriented to person, place, and time.  Psychiatric:        Mood and Affect: Mood normal.        Behavior: Behavior normal.        Assessment & Plan:  Uncontrolled type 2 diabetes mellitus with hyperglycemia (HCC) -     Evaristo Bury FlexTouch; Inject 10 units sq every evening for 2 nights, then increase by 2 units every 2 days until AM glucose is 140 or less.  Dispense: 3 mL; Refill: 2 -     FreeStyle Libre 14 Day Sensor; APPLY EVERY 14 DAYS TO CHECK BLOOD SUGAR  Dispense: 1 each; Refill: 11 -     FreeStyle Libre 14 Day Reader; Apply 1 each topically continuous. Apply reader for  monitoring of continuous glucose monitoring.  Dispense: 1 each; Refill: 11  Type 2 diabetes mellitus with hyperglycemia, without long-term current use of insulin (HCC) Assessment & Plan: Lab Results  Component Value Date   HGBA1C 12.4 (A) 02/01/2023   Very uncontrolled Start back on insulin as directed glipizide 10 mg BID Log glucose readings at home and bring to appt Freestyle Rx today Close f/up with me Cut out sweet tea encouraged  Orders: -     POCT glycosylated hemoglobin (Hb A1C) -     glipiZIDE; Take 1 tablet (10 mg total) by mouth 2 (two) times daily before a meal.  Dispense: 60 tablet; Refill: 2  Essential hypertension Assessment & Plan: Normotensive, stable, chronic Lisinopril 10 mg every day   Orders: -     Lisinopril; Take 1 tablet (10 mg total) by mouth daily.  Dispense: 30 tablet; Refill: 2  Need for influenza vaccination -     Flu vaccine trivalent PF, 6mos and older(Flulaval,Afluria,Fluarix,Fluzone)  Screening for colon cancer -     Cologuard        Return in about 4 weeks (around 03/01/2023) for  recheck/follow-up.     Elenna Spratling M Murvin Gift, PA-C

## 2023-02-01 NOTE — Patient Instructions (Addendum)
Lab Results  Component Value Date   HGBA1C 12.4 (A) 02/01/2023   HGBA1C 11.6 (A) 05/20/2022   HGBA1C 7.6 (A) 06/11/2018   Diabetes is very uncontrolled. Please monitor glucose readings at least once daily and bring back record in next 4-6 weeks. Titrate up on insulin (start at 10 units, then increase by 2 units every 2 days).   Be sure to have your eyes checked at least once per year!  Continue glipizide and lisinopril.  Return cologuard in mail as soon as possible.

## 2023-02-08 ENCOUNTER — Ambulatory Visit
Admission: RE | Admit: 2023-02-08 | Discharge: 2023-02-08 | Disposition: A | Discharge status: Home / Self Care | Payer: BLUE CROSS/BLUE SHIELD | Source: Ambulatory Visit | Attending: Physician Assistant

## 2023-02-08 DIAGNOSIS — Z1231 Encounter for screening mammogram for malignant neoplasm of breast: Secondary | ICD-10-CM

## 2023-02-16 ENCOUNTER — Encounter: Payer: Self-pay | Admitting: Physician Assistant

## 2023-02-16 NOTE — Telephone Encounter (Signed)
Please see message and advise 

## 2023-02-17 NOTE — Telephone Encounter (Signed)
FYI pt contacting insurance to see what is covered

## 2023-03-01 ENCOUNTER — Ambulatory Visit: Payer: Self-pay | Admitting: Physician Assistant

## 2023-03-10 LAB — COLOGUARD: COLOGUARD: NEGATIVE

## 2023-04-12 ENCOUNTER — Encounter: Payer: BLUE CROSS/BLUE SHIELD | Admitting: Obstetrics & Gynecology

## 2023-04-19 ENCOUNTER — Encounter: Payer: Medicaid Other | Admitting: Obstetrics & Gynecology

## 2023-06-15 ENCOUNTER — Emergency Department (HOSPITAL_BASED_OUTPATIENT_CLINIC_OR_DEPARTMENT_OTHER)
Admission: EM | Admit: 2023-06-15 | Discharge: 2023-06-15 | Disposition: A | Discharge status: Home / Self Care | Payer: No Typology Code available for payment source | Attending: Emergency Medicine | Admitting: Emergency Medicine

## 2023-06-15 ENCOUNTER — Encounter (HOSPITAL_BASED_OUTPATIENT_CLINIC_OR_DEPARTMENT_OTHER): Payer: Self-pay | Admitting: Emergency Medicine

## 2023-06-15 ENCOUNTER — Emergency Department (HOSPITAL_BASED_OUTPATIENT_CLINIC_OR_DEPARTMENT_OTHER): Payer: No Typology Code available for payment source | Admitting: Radiology

## 2023-06-15 ENCOUNTER — Other Ambulatory Visit: Payer: Self-pay

## 2023-06-15 DIAGNOSIS — I10 Essential (primary) hypertension: Secondary | ICD-10-CM | POA: Diagnosis not present

## 2023-06-15 DIAGNOSIS — E119 Type 2 diabetes mellitus without complications: Secondary | ICD-10-CM | POA: Diagnosis not present

## 2023-06-15 DIAGNOSIS — Z79899 Other long term (current) drug therapy: Secondary | ICD-10-CM | POA: Diagnosis not present

## 2023-06-15 DIAGNOSIS — M545 Low back pain, unspecified: Secondary | ICD-10-CM | POA: Diagnosis present

## 2023-06-15 DIAGNOSIS — Z7984 Long term (current) use of oral hypoglycemic drugs: Secondary | ICD-10-CM | POA: Insufficient documentation

## 2023-06-15 DIAGNOSIS — Z794 Long term (current) use of insulin: Secondary | ICD-10-CM | POA: Insufficient documentation

## 2023-06-15 MED ORDER — HYDROCODONE-ACETAMINOPHEN 5-325 MG PO TABS
1.0000 | ORAL_TABLET | Freq: Once | ORAL | Status: AC
  Administered 2023-06-15: 1 via ORAL
  Filled 2023-06-15: qty 1

## 2023-06-15 MED ORDER — HYDROCODONE-ACETAMINOPHEN 5-325 MG PO TABS
1.0000 | ORAL_TABLET | ORAL | 0 refills | Status: AC | PRN
  Filled 2023-06-15: qty 10, 2d supply, fill #0

## 2023-06-15 NOTE — Discharge Instructions (Signed)
You were seen for your back pain in the emergency department.   At home, please use over-the-counter Tylenol, ibuprofen, and lidocaine patches.  You may also use the Norco we have prescribed you as needed for pain.  Do not take the Norco before driving or operating heavy machinery.  Follow-up with your primary doctor in 2-3 days regarding your visit.  Follow-up with the spine clinic as soon as possible regarding your symptoms.  Return immediately to the emergency department if you experience any of the following: Numbness or weakness of your legs, bowel or bladder incontinence, numbness while wiping after pooping or urinating, or any other concerning symptoms.    Thank you for visiting our Emergency Department. It was a pleasure taking care of you today.

## 2023-06-15 NOTE — ED Provider Notes (Signed)
[place] EMERGENCY DEPARTMENT AT [facility] Provider Note   CSN: [id number] Arrival date & time: 06/15/23  1816     History  Chief Complaint  Patient presents with   Back Pain    [name] is a 63 y.o. female.  63 year old female with a history of hypertension and diabetes who presents emergency department with back pain.  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  Says that she started having right sided lower back pain approximately 1 week ago.  Sharp and will be 8/10 in severity when she is moving or lifting things.  At rest there is no pain at all.  No radiation down her legs.  No bowel or bladder incontinence.  Not on blood thinners.  No history of cancer, fevers, back trauma, or IV drug use.       Home Medications Prior to Admission medications   Medication Sig Start Date End Date Taking? Authorizing Provider  HYDROcodone-acetaminophen (NORCO/VICODIN) 5-325 MG tablet Take 1 tablet by mouth every 4 (four) hours as needed. 06/15/23  Yes Rondel Baton, MD  blood glucose meter kit and supplies KIT Dispense based on patient and insurance preference. Use up to four times daily as directed. 05/20/22   Allwardt, Crist Infante, PA-C  Continuous Glucose Receiver (FREESTYLE LIBRE 14 DAY READER) DEVI Apply 1 each topically continuous. Apply reader for monitoring of continuous glucose monitoring. 02/01/23   Allwardt, Crist Infante, PA-C  Continuous Glucose Sensor (FREESTYLE LIBRE 14 DAY SENSOR) MISC APPLY EVERY 14 DAYS TO CHECK BLOOD SUGAR 02/01/23   Allwardt, Alyssa M, PA-C  glipiZIDE (GLUCOTROL) 10 MG tablet Take 1 tablet (10 mg total) by mouth 2 (two) times daily before a meal. 02/01/23   Allwardt, Alyssa M, PA-C  insulin degludec (TRESIBA FLEXTOUCH) 100 UNIT/ML FlexTouch Pen Inject 10 units sq every evening for 2 nights, then increase by 2 units every 2 days until AM glucose is 140 or less. 02/01/23   Allwardt, Alyssa M, PA-C  lisinopril (ZESTRIL) 10 MG tablet Take 1  tablet (10 mg total) by mouth daily. 02/01/23   Allwardt, Crist Infante, PA-C      Allergies    Latex and Metformin and related    Review of Systems   Review of Systems  Physical Exam Updated Vital Signs BP (!) 154/83   Pulse 85   Temp 99.4 F (37.4 C)   Resp 16   SpO2 96%  Physical Exam Abdominal:     General: Abdomen is flat. There is no distension.     Palpations: There is no mass.     Tenderness: There is no abdominal tenderness. There is no guarding.  Musculoskeletal:     Comments: No spinal midline TTP in cervical, thoracic, or lumbar spine.  Paraspinal tenderness to palpation at approximately L3-L4.  No stepoffs noted.   Motor: Muscle bulk and tone are normal. Strength is 5/5 in hip flexion, knee flexion and extension, ankle dorsiflexion and plantar flexion bilaterally. Full strength of great toe dorsiflexion bilaterally.  Sensory: Intact sensation to light touch in L2 though S1 dermatomes bilaterally.   Reflexes: Patellar 2+ bilaterally, Achilles 2+ bilaterally, no ankle clonus bilaterally     ED Results / Procedures / Treatments   Labs (all labs ordered are listed, but only abnormal results are displayed) Labs Reviewed - No data to display  EKG None  Radiology DG Lumbar Spine Complete Result Date: 06/15/2023 CLINICAL DATA:  Chronic back pain EXAM: LUMBAR SPINE - COMPLETE 4+ VIEW COMPARISON:  None Available. FINDINGS: Five lumbar type vertebral bodies are well visualized. Vertebral body height is well maintained. No pars defects are noted. Mild degenerative anterolisthesis of L4 on L5 is noted. IMPRESSION: Degenerative changes without acute abnormality. Electronically Signed   By: Alcide Clever M.D.   On: 06/15/2023 19:45    Procedures Procedures    Medications Ordered in ED Medications  HYDROcodone-acetaminophen (NORCO/VICODIN) 5-325 MG per tablet 1 tablet (1 tablet Oral Given 06/15/23 2145)    ED Course/ Medical Decision Making/ A&P                                  Medical Decision Making Amount and/or Complexity of Data Reviewed Radiology: ordered.  Risk Prescription drug management.   [name] is a 63 y.o. female with comorbidities that complicate the patient evaluation including hypertension and diabetes who presents emergency department with back pain.    Initial Ddx:  Muscle strain, radiculopathy, kidney stone, pyonephritis, spinal epidural abscess, spinal epidural hematoma, pathologic fracture  MDM/Course:  Patient resents emergency department with back pain that is worsened with lifting.  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  No other high risk features of her back pain including fever, IV drug use, history of cancer, blood thinner use.  Pain appears to be paraspinal but is not reproducible.  No signs of spinal cord compression.  She is neurovascular intact in her legs.  Had an x-ray that did not show signs of pathologic fracture.  Given Norco for her back pain which she says has improved upon reevaluation.  Will have her follow-up with spine surgery and was given a short course of Norco to take for her pain at home.  This patient presents to the ED for concern of complaints listed in HPI, this involves an extensive number of treatment options, and is a complaint that carries with it a high risk of complications and morbidity. Disposition including potential need for admission considered.   Dispo: DC Home. Return precautions discussed including, but not limited to, those listed in the AVS. Allowed pt time to ask questions which were answered fully prior to dc.  Additional history obtained from family Records reviewed Outpatient Clinic Notes I have reviewed the patients home medications and made adjustments as needed  Portions of this note were generated with Dragon dictation software. Dictation errors may occur despite best attempts at proofreading.     Final  Clinical Impression(s) / ED Diagnoses Final diagnoses:  Acute right-sided low back pain without sciatica    Rx / DC Orders ED Discharge Orders          Ordered    HYDROcodone-acetaminophen (NORCO/VICODIN) 5-325 MG tablet  Every 4 hours PRN        06/15/23 2139              Rondel Baton, MD 06/16/23 1544

## 2023-06-15 NOTE — ED Triage Notes (Signed)
Lower right back pain. Denies recent fall/trauma. Non-radiating. Denies N/V/D, CP, SOB, or urinary changes.

## 2023-06-16 ENCOUNTER — Other Ambulatory Visit (HOSPITAL_BASED_OUTPATIENT_CLINIC_OR_DEPARTMENT_OTHER): Payer: Self-pay

## 2023-06-16 ENCOUNTER — Other Ambulatory Visit: Payer: Self-pay

## 2023-06-26 ENCOUNTER — Other Ambulatory Visit (HOSPITAL_BASED_OUTPATIENT_CLINIC_OR_DEPARTMENT_OTHER): Payer: Self-pay

## 2023-08-01 ENCOUNTER — Encounter: Payer: Self-pay | Admitting: Physician Assistant
